# Patient Record
Sex: Male | Born: 1965 | Hispanic: Yes | Marital: Single | Smoking: Never smoker
Health system: Southern US, Community
[De-identification: ages and names within clinical notes are randomized; demographics above are authoritative.]

## PROBLEM LIST (undated history)

## (undated) DIAGNOSIS — E119 Type 2 diabetes mellitus without complications: Secondary | ICD-10-CM

## (undated) HISTORY — PX: HEMORRHOID SURGERY: SHX153

---

## 2008-04-04 ENCOUNTER — Emergency Department: Payer: Self-pay | Admitting: Internal Medicine

## 2011-06-06 ENCOUNTER — Emergency Department: Payer: Self-pay | Admitting: Emergency Medicine

## 2019-02-22 ENCOUNTER — Emergency Department: Payer: Self-pay

## 2019-02-22 ENCOUNTER — Emergency Department
Admission: EM | Admit: 2019-02-22 | Discharge: 2019-02-22 | Disposition: A | Discharge status: Home / Self Care | Payer: Self-pay | Attending: Emergency Medicine | Admitting: Emergency Medicine

## 2019-02-22 ENCOUNTER — Other Ambulatory Visit: Payer: Self-pay

## 2019-02-22 DIAGNOSIS — R42 Dizziness and giddiness: Secondary | ICD-10-CM | POA: Insufficient documentation

## 2019-02-22 DIAGNOSIS — R29818 Other symptoms and signs involving the nervous system: Secondary | ICD-10-CM | POA: Insufficient documentation

## 2019-02-22 DIAGNOSIS — R1110 Vomiting, unspecified: Secondary | ICD-10-CM | POA: Insufficient documentation

## 2019-02-22 DIAGNOSIS — E119 Type 2 diabetes mellitus without complications: Secondary | ICD-10-CM | POA: Insufficient documentation

## 2019-02-22 DIAGNOSIS — H9312 Tinnitus, left ear: Secondary | ICD-10-CM | POA: Insufficient documentation

## 2019-02-22 HISTORY — DX: Type 2 diabetes mellitus without complications: E11.9

## 2019-02-22 LAB — URINALYSIS, COMPLETE (UACMP) WITH MICROSCOPIC
Bacteria, UA: NONE SEEN
Bilirubin Urine: NEGATIVE
Glucose, UA: >=500 mg/dL — AB
Hgb urine dipstick: NEGATIVE
Ketones, ur: 20 mg/dL — AB
Leukocytes,Ua: NEGATIVE
Nitrite: NEGATIVE
Protein, ur: 30 mg/dL — AB
Specific Gravity, Urine: 1.03 (ref 1.005–1.030)
Squamous Epithelial / LPF: NONE SEEN (ref 0–5)
pH: 5 (ref 5.0–8.0)

## 2019-02-22 LAB — BASIC METABOLIC PANEL
Anion gap: 15 (ref 5–15)
BUN: 16 mg/dL (ref 6–20)
CO2: 20 mmol/L — ABNORMAL LOW (ref 22–32)
Calcium: 9.3 mg/dL (ref 8.9–10.3)
Chloride: 101 mmol/L (ref 98–111)
Creatinine, Ser: 0.91 mg/dL (ref 0.61–1.24)
GFR calc Af Amer: >60 mL/min (ref >60)
GFR calc non Af Amer: >60 mL/min (ref >60)
Glucose, Bld: 469 mg/dL — ABNORMAL HIGH (ref 70–99)
Potassium: 3.9 mmol/L (ref 3.5–5.1)
Sodium: 136 mmol/L (ref 135–145)

## 2019-02-22 LAB — GLUCOSE, CAPILLARY: Glucose-Capillary: 452 mg/dL — ABNORMAL HIGH (ref 70–99)

## 2019-02-22 LAB — CBC
HCT: 48 % (ref 39.0–52.0)
Hemoglobin: 16.6 g/dL (ref 13.0–17.0)
MCH: 30.7 pg (ref 26.0–34.0)
MCHC: 34.6 g/dL (ref 30.0–36.0)
MCV: 88.7 fL (ref 80.0–100.0)
Platelets: 247 10*3/uL (ref 150–400)
RBC: 5.41 MIL/uL (ref 4.22–5.81)
RDW: 12.8 % (ref 11.5–15.5)
WBC: 15.2 10*3/uL — ABNORMAL HIGH (ref 4.0–10.5)
nRBC: 0 % (ref 0.0–0.2)

## 2019-02-22 MED ORDER — MECLIZINE HCL 25 MG PO TABS
25.0000 mg | ORAL_TABLET | Freq: Once | ORAL | Status: AC
  Administered 2019-02-22: 25 mg via ORAL
  Filled 2019-02-22: qty 1

## 2019-02-22 MED ORDER — LORAZEPAM 2 MG/ML IJ SOLN
1.0000 mg | Freq: Once | INTRAMUSCULAR | Status: AC
  Administered 2019-02-22: 1 mg via INTRAVENOUS
  Filled 2019-02-22: qty 1

## 2019-02-22 MED ORDER — SODIUM CHLORIDE 0.9 % IV BOLUS
1000.0000 mL | Freq: Once | INTRAVENOUS | Status: AC
  Administered 2019-02-22: 13:00:00 1000 mL via INTRAVENOUS

## 2019-02-22 MED ORDER — PROMETHAZINE HCL 25 MG/ML IJ SOLN
25.0000 mg | Freq: Once | INTRAMUSCULAR | Status: AC
  Administered 2019-02-22: 16:00:00 25 mg via INTRAVENOUS
  Filled 2019-02-22: qty 1

## 2019-02-22 MED ORDER — MECLIZINE HCL 25 MG PO TABS: 25.0000 mg | ORAL_TABLET | Freq: Three times a day (TID) | ORAL | 0 refills | Status: AC | PRN

## 2019-02-22 MED ORDER — PROMETHAZINE HCL 25 MG/ML IJ SOLN
25.0000 mg | Freq: Once | INTRAMUSCULAR | Status: AC
  Administered 2019-02-22: 25 mg via INTRAVENOUS
  Filled 2019-02-22: qty 1

## 2019-02-22 MED ORDER — PROMETHAZINE HCL 25 MG PO TABS: 25.0000 mg | ORAL_TABLET | Freq: Four times a day (QID) | ORAL | 0 refills | Status: AC | PRN

## 2019-02-22 MED ORDER — LORAZEPAM 1 MG PO TABS: 1.0000 mg | ORAL_TABLET | Freq: Two times a day (BID) | ORAL | 0 refills | Status: AC

## 2019-02-22 MED ORDER — SODIUM CHLORIDE 0.9% FLUSH: 3.0000 mL | Freq: Once | INTRAVENOUS | Status: DC

## 2019-02-22 NOTE — Discharge Instructions (Addendum)
Take the Ativan 1 pill 3 times a day for the next few days, take the Antivert 1 pill 3 times a day.  These 2 will help with the spinning sensation.  Take the Phenergan 1 pill 4 times a day.  That will help with the nausea.  Please call Dr.Juengel, the ear nose and throat doctor.  Let his office know that you were seen in the emergency room for vertigo and you had ringing in your ear.  They should be able to see you and evaluate you further soon.  Please return here if you are worse.  Especially return here if you cannot keep down any fluids. [employer] se ha recomendado. Ativan 1 pildora 3 veses al dia por los sigiente 3 dias. Phenergan 4 veses al dia para la nauncia. Antivert 3 veses al dia para los mareos. Porfavor haga cita con el especialita de oido. Tomo mucho fluidos y continua sus medicamentos diario.

## 2019-02-22 NOTE — ED Provider Notes (Addendum)
[facility] Emergency Department Provider Note   ____________________________________________   First MD Initiated Contact with Patient 02/22/19 1110     (approximate)  I have reviewed the triage vital signs and the nursing notes.   HISTORY  Chief Complaint Dizziness and Emesis   HPI [name] is a 53 y.o. male who reports onset of vertigo with vomiting this morning.  Much worse with any head movement.  He has no other complaints of chest pain, belly pain, diarrhea etc.  Patient has not had this before.  He does complain of a ringing in the left ear.  He has not had his medicines this morning and has not had any medicine since yesterday morning.  Blood sugar is high.         Past Medical History:  Diagnosis Date  . Diabetes mellitus without complication (HCC)     There are no active problems to display for this patient.   Past Surgical History:  Procedure Laterality Date  . HEMORRHOID SURGERY      Prior to Admission medications   Not on File    Allergies Patient has no known allergies.  No family history on file.  Social History Social History   Tobacco Use  . Smoking status: Never Smoker  . Smokeless tobacco: Never Used  Substance Use Topics  . Alcohol use: Not Currently  . Drug use: Not Currently    Review of Systems  Constitutional: No fever/chills Eyes: No visual changes except vertigo/spinning. ENT: No sore throat. Cardiovascular: Denies chest pain. Respiratory: Denies shortness of breath. Gastrointestinal: No abdominal pain.  No nausea, no vomiting.  No diarrhea.  No constipation. Genitourinary: Negative for dysuria. Musculoskeletal: Negative for back pain. Skin: Negative for rash. Neurological: Negative for headaches, focal weakness   ____________________________________________   PHYSICAL EXAM:  VITAL SIGNS: ED Triage Vitals  Enc Vitals Group     BP 02/22/19 0947 (!) 167/88     Pulse Rate  02/22/19 0947 73     Resp 02/22/19 0947 20     Temp 02/22/19 0947 97.8 F (36.6 C)     Temp Source 02/22/19 0947 Oral     SpO2 02/22/19 0947 96 %     Weight 02/22/19 1004 190 lb (86.2 kg)     Height 02/22/19 1004 5\' 6"  (1.676 m)     Head Circumference --      Peak Flow --      Pain Score 02/22/19 1004 0     Pain Loc --      Pain Edu? --      Excl. in GC? --     Constitutional: Alert and oriented.  Looks ill and vomiting Eyes: Conjunctivae are normal. PER. EOMI. he is having nystagmus horizontal Head: Atraumatic. Nose: No congestion/rhinnorhea. Mouth/Throat: Mucous membranes are moist.  Oropharynx non-erythematous. Neck: No stridor.  { Cardiovascular: Normal rate, regular rhythm. Grossly normal heart sounds.  Good peripheral circulation. Respiratory: Normal respiratory effort.  No retractions. Lungs CTAB. Gastrointestinal: Soft and nontender. No distention. No abdominal bruits. No CVA tenderness. Musculoskeletal: No lower extremity tenderness nor edema.  Neurologic:  Normal speech and language. No gross focal neurologic deficits are appreciated.  Skin:  Skin is warm, dry and intact. No rash noted.   ____________________________________________   LABS (all labs ordered are listed, but only abnormal results are displayed)  Labs Reviewed  BASIC METABOLIC PANEL - Abnormal; Notable for the following components:      Result Value   CO2  20 (*)    Glucose, Bld 469 (*)    All other components within normal limits  CBC - Abnormal; Notable for the following components:   WBC 15.2 (*)    All other components within normal limits  URINALYSIS, COMPLETE (UACMP) WITH MICROSCOPIC - Abnormal; Notable for the following components:   Color, Urine STRAW (*)    APPearance CLEAR (*)    Glucose, UA >=500 (*)    Ketones, ur 20 (*)    Protein, ur 30 (*)    All other components within normal limits  GLUCOSE, CAPILLARY - Abnormal; Notable for the following components:   Glucose-Capillary 452  (*)    All other components within normal limits  CBG MONITORING, ED  CBG MONITORING, ED   ____________________________________________  EKG  EKG read interpreted by me shows normal sinus rhythm rate of 68 left axis no acute ST-T wave changes ____________________________________________  RADIOLOGY  ED MD interpretation: MRI read by radiology reviewed by me shows no acute disease  Official radiology report(s): Mr Brain Wo Contrast  Result Date: 02/22/2019 CLINICAL DATA:  Neuro deficits, subacute. Patient has had dizziness and emesis this morning, worsened by head movement, sensation at the room is "spinning" EXAM: MRI HEAD WITHOUT CONTRAST TECHNIQUE: Multiplanar, multiecho pulse sequences of the brain and surrounding structures were obtained without intravenous contrast. COMPARISON:  No pertinent prior studies available for comparison. FINDINGS: Brain: No restricted diffusion is demonstrated to suggest acute or recent subacute infarction. No evidence of intracranial mass. No intracranial hemorrhage, midline shift or extra-axial collection. There are a few punctate foci of T2/FLAIR hyperintensity scattered within the cerebral white matter, nonspecific. Cerebral volume is age appropriate. Vascular: Flow voids maintained within the proximal large vessels. Skull and upper cervical spine: Normal marrow signal. Sinuses/Orbits: The imaged globes and orbits are unremarkable. Trace ethmoid sinus mucosal thickening. No significant mastoid effusion. IMPRESSION: No evidence of acute intracranial abnormality on this non-contrast brain MRI. Specifically, no evidence of acute or recent subacute infarction. Several punctate signal changes scattered within the cerebral white matter are nonspecific, but may reflect minimal chronic small-vessel ischemic disease. Electronically Signed   By: Kellie Simmering   On: 02/22/2019 13:14    ____________________________________________   PROCEDURES  Procedure(s) performed  (including Critical Care):  Procedures   ____________________________________________   INITIAL IMPRESSION / ASSESSMENT AND PLAN / ED COURSE  Patient feeling better.  He is able to keep things down is not spinning or having vertigo.  He can keep his medicines down as well.  We will let him take his medicines and follow-up with ENT.  Return if worse.              ____________________________________________   FINAL CLINICAL IMPRESSION(S) / ED DIAGNOSES  Final diagnoses:  Vertigo     ED Discharge Orders    None       Note:  This document was prepared using Dragon voice recognition software and may include unintentional dictation errors.    Nena Polio, MD 02/22/19 1459    Nena Polio, MD 02/22/19 [phone]

## 2019-02-22 NOTE — ED Notes (Signed)
Patient off unit to MRI.

## 2019-02-22 NOTE — ED Triage Notes (Signed)
Pt states when he got up this morning was feeling very dizzy "like I was drunk".. with N/v.. denies any pain.

## 2019-02-22 NOTE — ED Notes (Signed)
Md at bedside to discuss MRI finding and follow plan with ENT.

## 2019-02-22 NOTE — ED Notes (Signed)
Via interpreter: pt has had dizziness and emesis this morning, worsening when he moves his head side to side. Denies abdominal pain, diarrhea, cp, sob, urinary symptoms. Pt has has numerous episodes of emesis; states room is "spinning."

## 2019-02-22 NOTE — ED Notes (Signed)
Returned from MRI 

## 2019-02-22 NOTE — ED Notes (Signed)
Assumed care of patient. As per patient feeling weak and fatigued yesterday, today woke up very dizzy and vomiting. Reports unable to stand without room spinning, any movement makes him dizzy and vomit. Reports has to stay still with eyes closed and no movement otherwise he feels dizzy and vomits". Denies hx of vertigo.  Awaiting md eval and plan of care.

## 2019-02-26 ENCOUNTER — Other Ambulatory Visit: Payer: Self-pay | Admitting: Physician Assistant

## 2019-02-26 DIAGNOSIS — H9120 Sudden idiopathic hearing loss, unspecified ear: Secondary | ICD-10-CM

## 2019-03-10 ENCOUNTER — Other Ambulatory Visit: Payer: Self-pay

## 2019-03-10 ENCOUNTER — Ambulatory Visit
Admission: RE | Admit: 2019-03-10 | Discharge: 2019-03-10 | Disposition: A | Discharge status: Home / Self Care | Payer: Self-pay | Source: Ambulatory Visit | Attending: Physician Assistant | Admitting: Physician Assistant

## 2019-03-10 DIAGNOSIS — H9120 Sudden idiopathic hearing loss, unspecified ear: Secondary | ICD-10-CM | POA: Insufficient documentation

## 2019-03-10 MED ORDER — GADOBUTROL 1 MMOL/ML IV SOLN
7.5000 mL | Freq: Once | INTRAVENOUS | Status: AC | PRN
  Administered 2019-03-10: 7.5 mL via INTRAVENOUS

## 2019-11-17 IMAGING — MR MR BRAIN/IAC WITHOUT AND WITH CONTRAST
10 of 13 series · 26 of 48 positions shown · IV contrast (7.5ml Gadavist)
Comparison: Brain MRI 02/22/2019

CLINICAL DATA: 52-year-old male with 3 weeks of acute left side
hearing loss. No known injury. Positive for [id number] in [REDACTED].

EXAM:
MRI HEAD WITHOUT AND WITH CONTRAST
TECHNIQUE: Multiplanar, multiecho pulse sequences of the brain and surrounding
structures were obtained without and with intravenous contrast.
CONTRAST:  7.5 milliliters Gadavist

[Series 5: T1 · sagittal · 5.0mm · 0.62mm/px · 2 of 23 slices shown (1 of 3)]
[im 1/23]
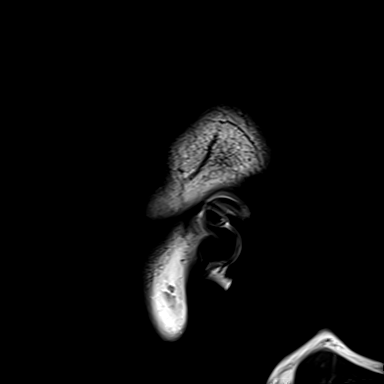
[im 23/23]
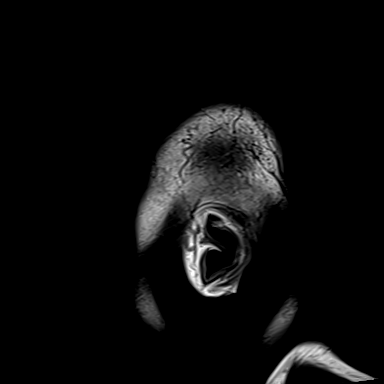

[Series 6: ax dwi_tracew · axial · 3.0mm · 0.60mm/px · z∈[-39,+107]mm · 4 of 48 slices shown]
[im 1/48]
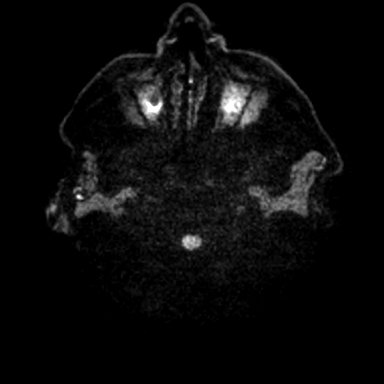
[im 16/48]
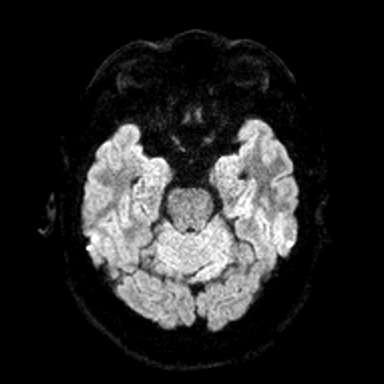
[im 32/48]
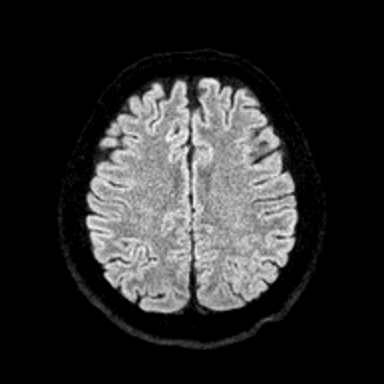
[im 48/48]
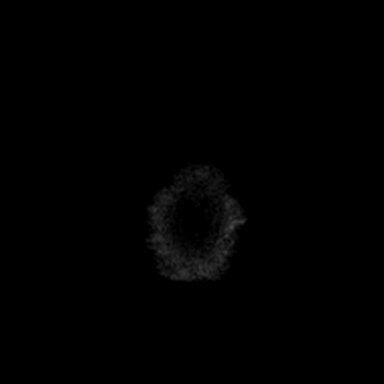

[Series 7: ax dwi_adc · axial · 3.0mm · 0.60mm/px · z∈[-39,+8]mm · 2 of 48 slices shown]
[im 1/48]
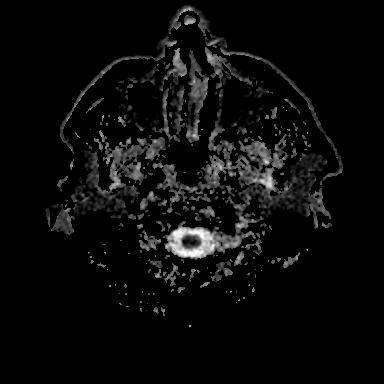
[im 16/48]
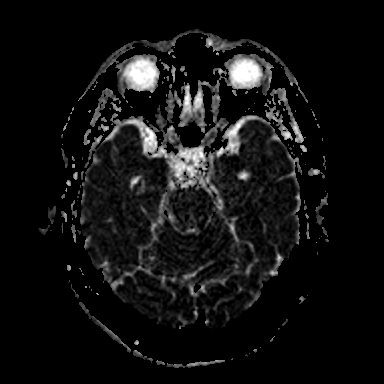

[Series 8: T2 · axial · 5.0mm · 0.53mm/px · z∈[-39,+107]mm · 2 of 27 slices shown]
[im 1/27]
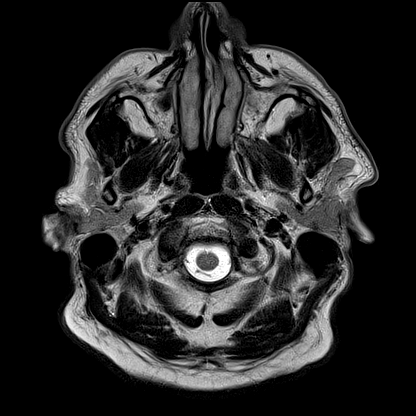
[im 27/27]
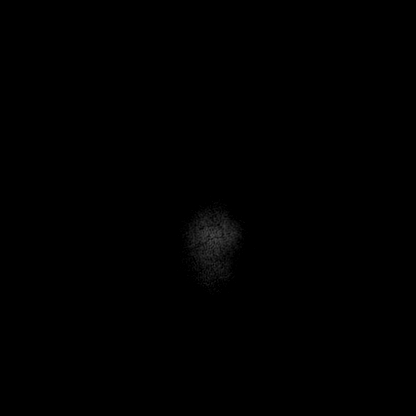

[Series 13: FLAIR · axial · 3.0mm · 0.53mm/px · z∈[-42,+110]mm · 4 of 55 slices shown]
[im 1/55]
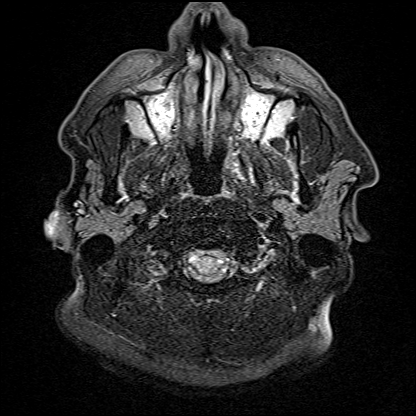
[im 19/55]
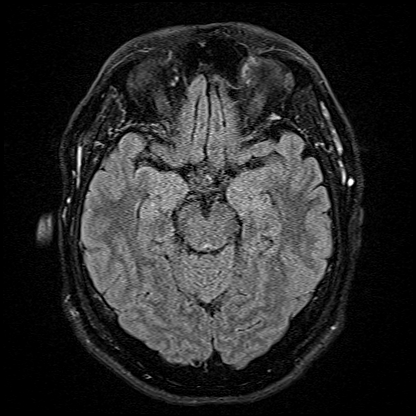
[im 37/55]
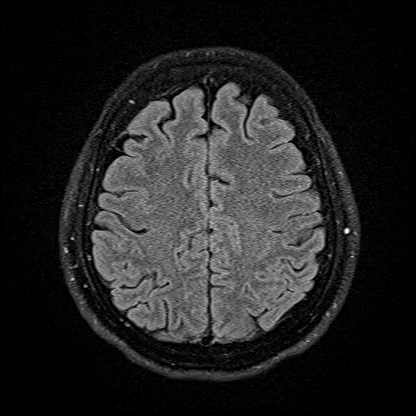
[im 55/55]
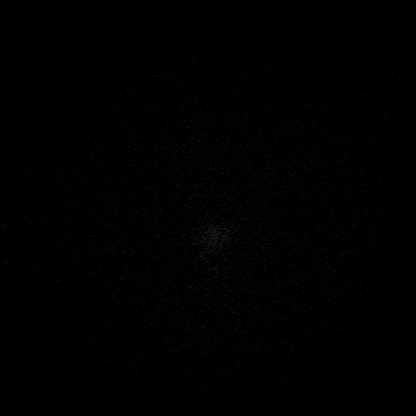

[Series 15: T1 · axial · non-contrast · 3.0mm · 0.21mm/px · 1 of 15 slices shown (2 of 3)]
[im 1/15]
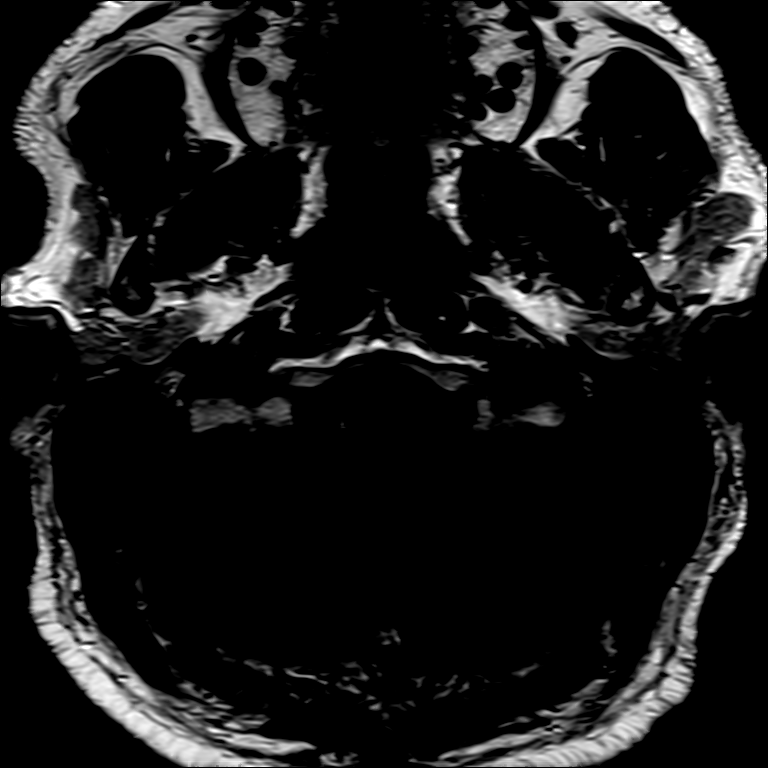

[Series 16: T1 · coronal · non-contrast · 3.0mm · 0.21mm/px · 1 of 13 slices shown (3 of 3)]
[im 1/13]
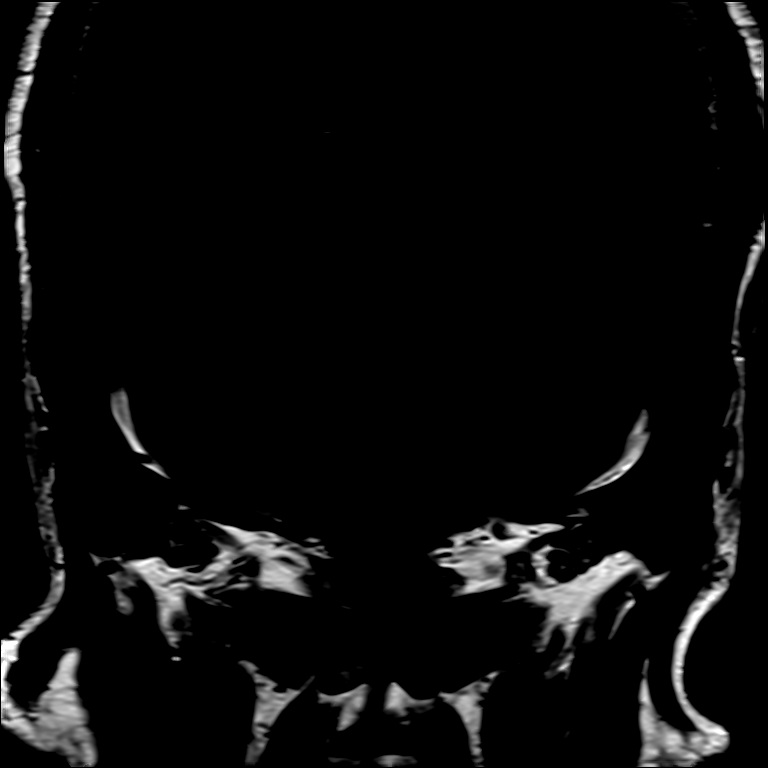

[Series 17: T1 post-contrast · axial · 3.0mm · 0.21mm/px · 1 of 15 slices shown (1 of 3)]
[im 1/15]
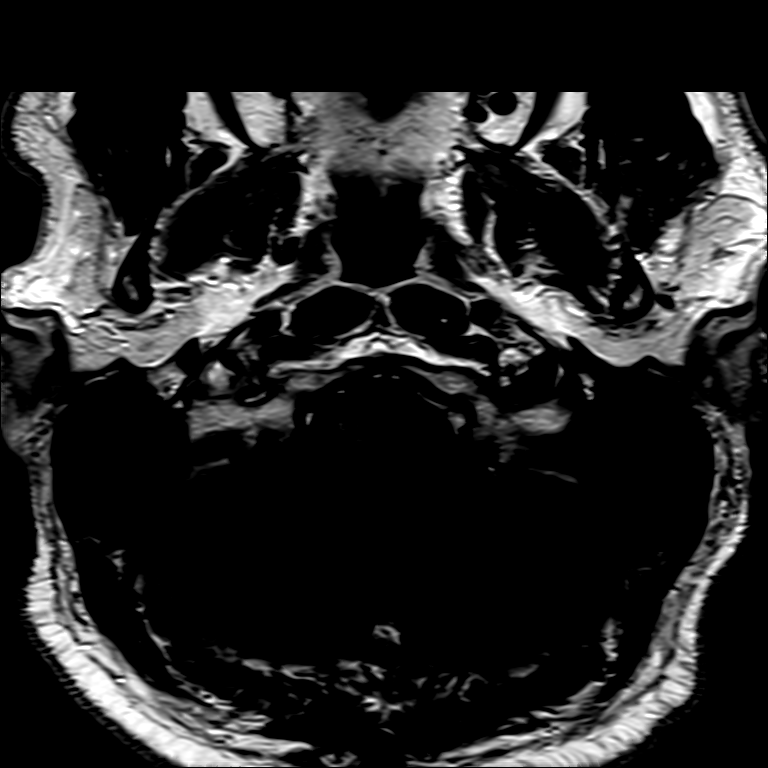

[Series 18: T1 post-contrast · coronal · 3.0mm · 0.21mm/px · 1 of 13 slices shown (2 of 3)]
[im 1/13]
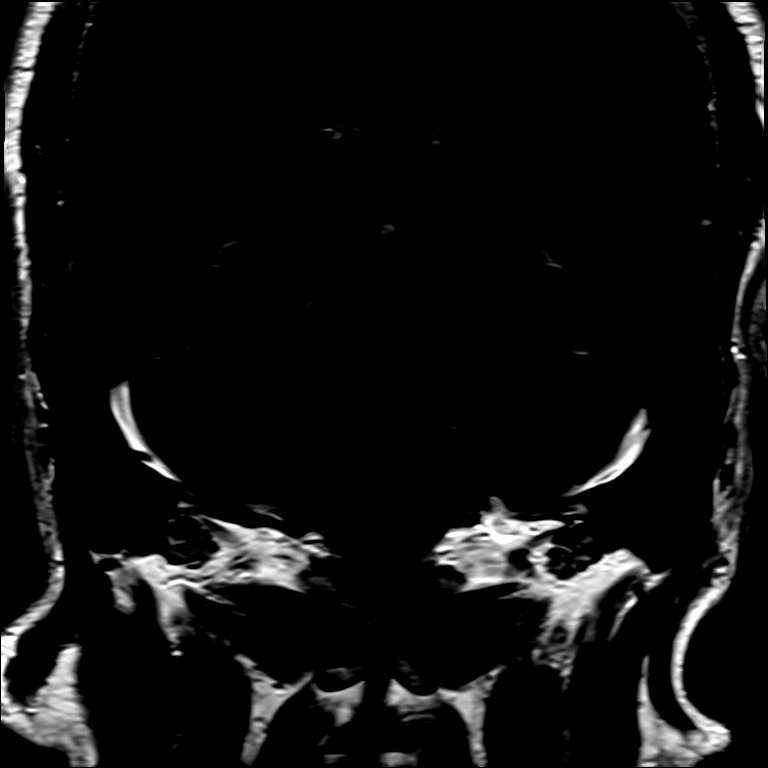

[Series 19: T1 post-contrast · axial · 1.0mm · 0.98mm/px · z∈[-42,+121]mm · 8 of 176 slices shown (3 of 3)]
[im 1/176]
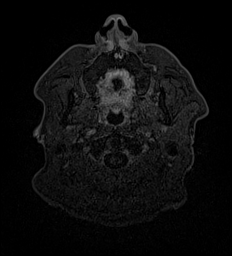
[im 27/176]
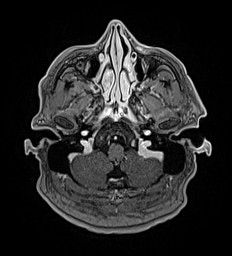
[im 54/176]
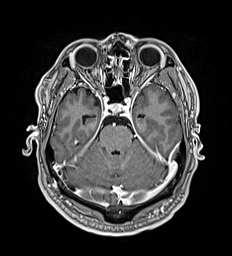
[im 81/176]
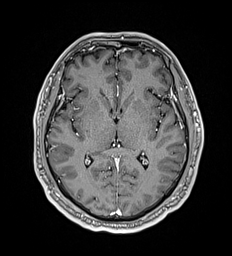
[im 95/176]
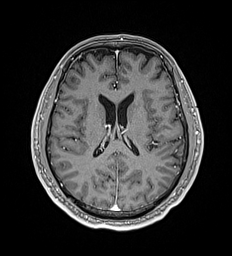
[im 122/176]
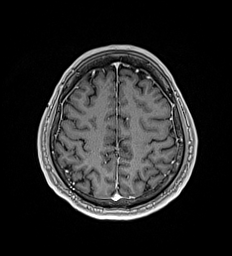
[im 149/176]
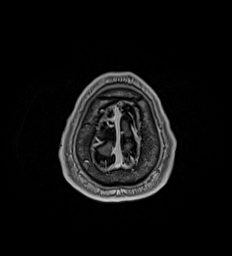
[im 176/176]
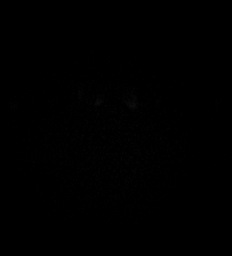

[26 of 48 positions shown; findings below may reference images not displayed]

FINDINGS: Brain: No restricted diffusion to suggest acute infarction. No
midline shift, mass effect, evidence of mass lesion,
ventriculomegaly, extra-axial collection or acute intracranial
hemorrhage. Cervicomedullary junction and pituitary are within
normal limits.

Gray and white matter signal is stable and within normal limits for
age. No cortical encephalomalacia. No chronic blood products. No
abnormal enhancement identified.

Vascular: Major intracranial vascular flow voids are stable.

Skull and upper cervical spine: Negative visible cervical spine.
Visualized bone marrow signal is within normal limits.

Sinuses/Orbits: Stable and negative orbits. Paranasal sinuses are
well pneumatized, there is some nonspecific maxillary sinus
periosteal thickening. Scalp and face soft tissues appear negative.

Other:

Dedicated internal auditory imaging. Normal cerebellopontine angles.
Normal bilateral cisternal and intracanalicular 7th and 8th cranial
nerve segments. Symmetric and normal T2 signal in the bilateral
cochlea and vestibular structures. Bilateral mastoids remain clear.
Normal stylomastoid foramina. No abnormal enhancement identified. No
skull base abnormality identified, the cavernous sinus appears
normal. Bilateral parotid glands appear normal.
IMPRESSION: 1. Negative internal auditory imaging.
2. Stable and normal for age MRI appearance of the brain.

## 2020-12-06 DIAGNOSIS — H8110 Benign paroxysmal vertigo, unspecified ear: Secondary | ICD-10-CM | POA: Insufficient documentation

## 2020-12-06 DIAGNOSIS — I160 Hypertensive urgency: Secondary | ICD-10-CM | POA: Insufficient documentation

## 2020-12-06 DIAGNOSIS — E119 Type 2 diabetes mellitus without complications: Secondary | ICD-10-CM | POA: Insufficient documentation

## 2020-12-06 DIAGNOSIS — I639 Cerebral infarction, unspecified: Secondary | ICD-10-CM | POA: Insufficient documentation

## 2021-02-28 ENCOUNTER — Ambulatory Visit: Payer: Medicaid Other

## 2021-03-02 ENCOUNTER — Ambulatory Visit: Payer: Medicaid Other

## 2021-03-02 ENCOUNTER — Ambulatory Visit: Payer: Medicaid Other | Attending: Physical Medicine and Rehabilitation

## 2021-03-02 ENCOUNTER — Other Ambulatory Visit: Payer: Self-pay

## 2021-03-02 DIAGNOSIS — R278 Other lack of coordination: Secondary | ICD-10-CM | POA: Insufficient documentation

## 2021-03-02 DIAGNOSIS — R269 Unspecified abnormalities of gait and mobility: Secondary | ICD-10-CM | POA: Insufficient documentation

## 2021-03-02 DIAGNOSIS — M6281 Muscle weakness (generalized): Secondary | ICD-10-CM | POA: Insufficient documentation

## 2021-03-02 DIAGNOSIS — R2689 Other abnormalities of gait and mobility: Secondary | ICD-10-CM | POA: Insufficient documentation

## 2021-03-02 DIAGNOSIS — R262 Difficulty in walking, not elsewhere classified: Secondary | ICD-10-CM

## 2021-03-02 DIAGNOSIS — R2681 Unsteadiness on feet: Secondary | ICD-10-CM

## 2021-03-02 NOTE — Therapy (Signed)
[place] MAIN [facility] SERVICES [address] [place], [place], [address] Phone: [phone]   Fax:  [phone]  Occupational Therapy Evaluation  Patient Details  Name: Dennis Mcintosh MRN: [id number] Date of Birth: [birth year] Referring Provider (OT): Dr. Morrie Sheldon   Encounter Date: 03/02/2021   OT End of Session - 03/02/21 0937     Visit Number 1    Number of Visits 1    Authorization Type Medicaid    Authorization Time Period 03/02/2021    OT Start Time 0825    OT Stop Time 0915    OT Time Calculation (min) 50 min    Equipment Utilized During Treatment wc    Activity Tolerance Patient tolerated treatment well    Behavior During Therapy [facility] for tasks assessed/performed             Past Medical History:  Diagnosis Date   Diabetes mellitus without complication ([facility])     Past Surgical History:  Procedure Laterality Date   HEMORRHOID SURGERY      There were no vitals filed for this visit.   Subjective Assessment - 03/02/21 0934     Subjective  "I used the walker at home with someone helpling me."    Patient is accompanied by: Family member;Interpreter    Pertinent History Recent CVA on 12/06/2020; ED visit d/t N/V related to vertigo    Limitations dizziness, balance, mobility    Patient Stated Goals "To get back to work and improve my walking."    Currently in Pain? No/denies    Multiple Pain Sites No               [facility] OT Assessment - 03/02/21 0001       Assessment   Medical Diagnosis CVA    Referring Provider (OT) Dr. Morrie Sheldon    Onset Date/Surgical Date 12/06/20    Hand Dominance Right    Next MD Visit Aug 15 PCP    Prior Therapy inpatient rehab      Precautions   Precautions Fall      Restrictions   Weight Bearing Restrictions No    Other Position/Activity Restrictions vertigo      Balance Screen   Has the patient fallen in the past 6 months No    Has the patient had a decrease in activity level  because of a fear of falling?  Yes    Is the patient reluctant to leave their home because of a fear of falling?  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  Performs  functional transfers with RW and caregiver supv/CGA      Mobility   Mobility Status Needs assist    Mobility Status Comments wc for community mobility, RW with 1 caregiver assist for house hold mobility      Written Expression   Dominant Hand Right      Vision - History   Baseline Vision No visual deficits    Patient Visual Report Nausea/blurring vision with head movement    Additional Comments occasional spinning/blurred vision related to vertigo      Vision Assessment   Tracking/Visual Pursuits Able to track stimulus in all quads without difficulty    Saccades Within functional limits    Visual Fields No apparent deficits      Cognition   Overall Cognitive Status Within Functional Limits for tasks assessed    Cognition Comments spouse reports that pt often asks if it's night or day and questions the date, though pt was oriented x4 this day.  OT recommended spouse place calendar and clock in bedroom or tx room to help pt with orientating self on a daily basis      Observation/Other  Assessments   Skin Integrity no denies any skin breakdown    Focus on Therapeutic Outcomes (FOTO)  88      Sensation   Light Touch Appears Intact      Coordination   Gross Motor Movements are Fluid and Coordinated Yes    Fine Motor Movements are Fluid and Coordinated Yes    Right 9 Hole Peg Test 36 sec    Left 9 Hole Peg Test 37 sec      AROM   Overall AROM Comments BUEs WNL      Strength   Overall Strength Comments BUEs grossly 5/5      Hand Function   Right Hand Grip (lbs) 59    Right Hand Lateral Pinch 20 lbs    Right Hand 3 Point Pinch 16 lbs    Left Hand Grip (lbs) 51    Left Hand Lateral Pinch 17 lbs    Left 3 point pinch 16 lbs            Occupational Therapy Evaluation: Pt is a 55 y/o male admitted to the hospital on 12/06/2020 with L hemisphere cerebellar CVA.  Pt was discharged home with family from inpatient rehab.  Returned to ED on 02/26/2021 d/t nausea/vomiting related to vertigo.  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  Pt currently using RW with caregiver assist for household mobility, wc for community mobility.  Pt's goal is to improve mobility and return to work.  Upon assessment, BUEs are WNL for strength, ROM, coordination, and sensation.  Pt reports occasional blurred vision or spinning related to his vertigo, but no other visual changes since CVA.  ADLs are performed with set up/supv d/t mobility deficits.  Pt has necessary AE/DME at home to perform ADLs with supv, which family is providing.  No additional skilled OT indicated at this time. Pt and spouse both agreeable to poc.  Pt will continue with PT to address mobility needs.    OT Education - 03/02/21 0936     Education Details OT role/goals/poc    Person(s) Educated Patient;Spouse;Child(ren)    Methods Explanation;Verbal cues    Comprehension Verbalized understanding              OT Short Term Goals - 03/02/21 3845  OT SHORT  TERM GOAL #1   Title AE needs met to safely perform ADLs.    Baseline Eval: goal met; pt has RW, wc, transfer tub bench, and 3in1 commode    Time 1    Period Days    Status Achieved    Target Date 03/02/21                      Plan - 03/02/21 1057     Clinical Impression Statement Pt is a 55 y/o male admitted to the hospital on 12/06/2020 with L hemisphere cerebellar CVA.  Pt was discharged home with family from inpatient rehab.  Returned to ED on 02/26/2021 d/t nausea/vomiting related to vertigo.  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  Pt currently using RW with caregiver assist for household mobility, wc for community mobility.  Pt's goal is to improve mobility and return to work.  Upon assessment, BUEs are WNL for strength, ROM, coordination, and sensation.  Pt reports occasional blurred vision or spinning related to his vertigo, but no other visual changes since CVA.  ?ADLs are performed with set up/supv d/t mobility deficits.  Pt has necessary AE/DME at home to perform ADLs with supv, which family is providing.  ?No additional skilled OT indicated at this time. Pt and spouse both agreeable to poc.  Pt will continue with PT to address mobility needs.    OT Occupational Profile and History Detailed Assessment- Review of Records and additional review of physical, cognitive, psychosocial history related to current functional performance    Occupational performance deficits (Please refer to evaluation for details): ADL's;IADL's;Work    Body Structure / Function / Physical Skills ADL;Endurance;Balance;Vestibular;Mobility;Gait    Rehab Potential Good    Clinical Decision Making Several treatment options, min-mod task modification necessary    Comorbidities Affecting Occupational Performance: May have comorbidities impacting occupational performance    Modification or Assistance to Complete Evaluation  Min-Moderate modification  of tasks or assist with assess necessary to complete eval    OT Frequency One time visit    Plan OT eval only.  Pt will see PT to address mobility needs.    Consulted and Agree with Plan of Care Patient;Family member/caregiver             Patient will benefit from skilled therapeutic intervention in order to improve the following deficits and impairments:   Body Structure / Function / Physical Skills: ADL, Endurance, Balance, Vestibular, Mobility, Gait       Visit Diagnosis: Muscle weakness (generalized)  Other lack of coordination    Problem List There are no problems to display for this patient.  Leta Speller, MS, OTR/L  Darleene Cleaver 03/02/2021, 10:59 AM  [place] MAIN [facility] SERVICES [address] [place], [place], [address] Phone: [phone]   Fax:  [phone]  Name: Dennis Mcintosh MRN: [id number] Date of Birth: 29-Jan-1966

## 2021-03-02 NOTE — Therapy (Signed)
[facility] MAIN [facility] SERVICES [address] [place], [place], [address] Phone: [phone]   Fax:  [phone]  Physical Therapy Evaluation  Patient Details  Name: Dennis Mcintosh MRN: [id number] Date of Birth: [birth year] Referring Provider (PT): Dr. Windle Guard   Encounter Date: 03/02/2021   PT End of Session - 03/02/21 0900     Visit Number 1    Number of Visits 25    Authorization Time Period Initial Certification 03/02/2021-05/25/2021    PT Start Time 0915    PT Stop Time 0958    PT Time Calculation (min) 43 min    Equipment Utilized During Treatment Gait belt    Activity Tolerance Patient tolerated treatment well    Behavior During Therapy [facility] for tasks assessed/performed             Past Medical History:  Diagnosis Date   Diabetes mellitus without complication (HCC)     Past Surgical History:  Procedure Laterality Date   HEMORRHOID SURGERY      There were no vitals filed for this visit.    Subjective Assessment - 03/02/21 0856     Subjective Patient reports having a stroke and complains of being weak with difficulty walking.    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 S/p removal of trach/G tube on 5/27. He most recently presented to ED on 02/26/2021 due to nausea and vomiting. He was at [facility] rehab from 01/31/2021- 02/23/2021.    Limitations Lifting;Standing;Walking;House hold activities    How long can you sit comfortably? no issues    How long can you stand comfortably? < 5 min    How long can you walk comfortably? < 5 min    Patient Stated Goals I want to go back to work    Currently in Pain? No/denies                [facility] PT Assessment - 03/02/21 0837       Assessment   Medical Diagnosis Left cerebellar  hemisphere CVA    Referring Provider (PT) Dr. Windle Guard    Onset Date/Surgical Date 12/06/20    Hand Dominance Right    Next MD Visit 03/12/2021    Prior Therapy inpatient rehab      Precautions   Precautions Fall      Restrictions   Weight Bearing Restrictions No      Balance Screen   Has the patient fallen in the past 6 months No    Has the patient had a decrease in activity level because of a fear of falling?  Yes    Is the patient reluctant to leave their home because of a fear of falling?  Yes      Home Environment   Living Environment Private residence    Living Arrangements Spouse/significant other;Children    Available Help at Discharge Family    Type of Home House    Home Access Stairs to enter    Entrance Stairs-Number of Steps 2    Entrance Stairs-Rails Can reach both    Home Layout One level    Home Equipment Walker - 2 wheels;Wheelchair - [employer];Tub bench      Prior Function   Level of Independence Independent    Vocation Full time employment    Development worker, community  Leisure enjoy spending time with family      Cognition   Overall Cognitive Status Within Functional Limits for tasks assessed    Attention Focused    Focused Attention Appears intact    Memory Appears intact    Awareness Appears intact    Problem Solving Appears intact    Executive Function Reasoning;Sequencing;Organizing;Decision Making;Initiating;Self Monitoring;Self Correcting    Reasoning Appears intact    Sequencing Appears intact    Organizing Appears intact    Decision Making Appears intact    Initiating Appears intact    Self Monitoring Appears intact    Self Correcting Appears intact      Observation/Other Assessments   Skin Integrity No observed or reported skin integrity issues    Focus on Therapeutic Outcomes (FOTO)  58      Sensation   Light Touch Appears Intact      Standardized Balance Assessment   Standardized Balance  Assessment Berg Balance Test;Timed Up and Go Test;Five Times Sit to Stand;10 meter walk test    Five times sit to stand comments  36.11 sec using BUE suport (unable to stand wihtout UEs)    10 Meter Walk 34.5 sec with RW=      Berg Balance Test   Sit to Stand Able to stand using hands after several tries    Standing Unsupported Unable to stand 30 seconds unassisted    Sitting with Back Unsupported but Feet Supported on Floor or Stool Able to sit safely and securely 2 minutes    Stand to Sit Sits independently, has uncontrolled descent    Transfers Needs one person to assist    Standing Unsupported with Eyes Closed Needs help to keep from falling    Standing Unsupported with Feet Together Needs help to attain position and unable to hold for 15 seconds    From Standing, Reach Forward with Outstretched Arm Loses balance while trying/requires external support    From Standing Position, Pick up Object from Floor Unable to try/needs assist to keep balance    From Standing Position, Turn to Look Behind Over each Shoulder Needs assist to keep from losing balance and falling    Turn 360 Degrees Needs assistance while turning    Standing Unsupported, Alternately Place Feet on Step/Stool Needs assistance to keep from falling or unable to try    Standing Unsupported, One Foot in [employer] balance while stepping or standing    Standing on One Leg Unable to try or needs assist to prevent fall    Total Score 8      Timed Up and Go Test   TUG Normal TUG    Normal TUG (seconds) 45.14             SUBJECTIVE Chief complaint: Onset: 12/06/2020- Left cerebellar CVA Recent changes in overall health/medication: yes- CVA Directional pattern for falls: None Prior history of physical therapy for balance: None  OBJECTIVE  MUSCULOSKELETAL: Tremor: Absent Bulk: Normal Tone: Normal, no clonus  Posture No gross abnormalities noted in standing or seated posture  Gait Patient ambulated approx 85  feet with use of RW, Catheter, and CGA using gait belt- short recipocal steps with loss of balance while turning. Patient reports fatigued with gait on level surfaces and exhibited gait speed of 0.29 m/s  Strength  *Please refer to OT eval (date 03/02/2021) for measurement of BUE Strength  R/L 4/4 Hip flexion 5/5 Hip external rotation 5/5 Hip internal rotation 5/5 Hip extension  5/5 Hip abduction 5/5  Hip adduction 4/4 Knee extension 4/4 Knee flexion 5/5 Ankle Plantarflexion 5/5 Ankle Dorsiflexion   NEUROLOGICAL:  Mental Status Patient is oriented to person, place and time.  Recent memory is intact.  Remote memory is intact.  Attention span and concentration are intact.  Expressive speech is intact. Patient is quiet and reserve per family report  Sensation Grossly intact to light touch bilateral UEs/LEs as determined by testing dermatomes C2-T2/L2-S2 respectively    FUNCTIONAL OUTCOME MEASURES   Results Comments  BERG 8/56 Fall risk, in need of intervention          TUG 45.14 seconds   5TSTS 36.11 seconds B UE support (unable to stand without BUE support)      10 Meter Gait Speed Self-selected: 34.5s = 0.29 m/s;  Below normative values for full community ambulation               ASSESSMENT Clinical Impression: Pt is a pleasant 55 year-old male referred with diagnosis of left cerebellar CVA. PT examination reveals deficits . Pt presents with deficits in LE functional strength as seen by difficulty standing, impaired gait and balance requiring RW for limited mobility and scoring 8/56 on BERG placing him at very high risk of falling. Pt will benefit from skilled PT services to address deficits in balance and decrease risk for future falls.                        Objective measurements completed on examination: See above findings.               PT Education - 03/02/21 0859     Education Details PT plan of care    Person(s) Educated  Patient    Methods Explanation;Verbal cues    Comprehension Verbalized understanding;Verbal cues required;Need further instruction              PT Short Term Goals - 03/02/21 1144       PT SHORT TERM GOAL #1   Title Pt will be independent with HEP in order to improve strength and balance in order to decrease fall risk and improve function at home and work.    Baseline 03/02/2021- Patient with no formal HEP in place    Time 6    Period Weeks    Status New    Target Date 04/13/21      PT SHORT TERM GOAL #2   Title Patient will demonstrate safe and modified independent sit to stand transfers for improved independence in the home and decreased caregiver assist.    Baseline 03/02/2021- Paitent requires min assist for safe transfers in clinic today.    Time 6    Period Weeks    Status New    Target Date 04/13/21               PT Long Term Goals - 03/02/21 1146       PT LONG TERM GOAL #1   Title Pt will improve FOTO to target score of 73 to display perceived improvements in ability to complete ADL's.    Baseline 03/02/2021= 58%    Time 12    Period Weeks    Status New    Target Date 05/25/21      PT LONG TERM GOAL #2   Title Pt will improve BERG by at least 13 points in order to demonstrate clinically significant improvement in balance.    Baseline 03/02/2021= 8/56    Time 12  Period Weeks    Status New    Target Date 05/25/21      PT LONG TERM GOAL #3   Title Pt will decrease 5TSTS by at least 10 seconds in order to demonstrate clinically significant improvement in LE strength.    Baseline 03/02/2021=36.11 sec with BUE Support    Time 12    Period Weeks    Status New    Target Date 05/25/21      PT LONG TERM GOAL #4   Title Pt will decrease TUG to below 18 seconds/decrease in order to demonstrate decreased fall risk.    Baseline 03/02/2021= 45.14 sec using RW    Time 12    Period Weeks    Status New    Target Date 05/25/21      PT LONG TERM GOAL #5   Title  Patient will be modified independent in walking on even/uneven surface with least restrictive assistive device, for 20+ minutes without rest break, reporting some difficulty or less to improve walking tolerance with community ambulation including grocery shopping, going to church,etc.    Baseline 03/02/2021= Patient limited to < 100 feet using RW    Time 12    Period Weeks    Status New    Target Date 05/25/21                    Plan - 03/02/21 1128     Clinical Impression Statement Pt is a pleasant 55 year-old male referred with diagnosis of left cerebellar CVA. PT examination reveals deficits . Pt presents with deficits in LE functional strength as seen by difficulty standing, impaired gait and balance requiring RW for limited mobility and scoring 8/56 on BERG placing him at very high risk of falling. Pt will benefit from skilled PT services to address deficits in balance and decrease risk for future falls.    Personal Factors and Comorbidities Comorbidity 1    Comorbidities diabetes    Examination-Activity Limitations Bathing;Bed Mobility;Bend;Caring for Others;Carry;Dressing;Lift;Locomotion Level;Squat;Stairs;Stand;Transfers    Examination-Participation Restrictions Cleaning;Community Activity;Driving;Occupation;Yard Work    Conservation officer, historic buildings Stable/Uncomplicated    Visual merchandiser    Rehab Potential Good    PT Frequency 2x / week    PT Duration 12 weeks    PT Treatment/Interventions ADLs/Self Care Home Management;Cryotherapy;Moist Heat;DME Instruction;Gait training;Stair training;Functional mobility training;Therapeutic activities;Therapeutic exercise;Balance training;Neuromuscular re-education;Patient/family education;Manual techniques;Passive range of motion;Dry needling    PT Next Visit Plan Initiate Balance and LE strengthening activities    PT Home Exercise Plan Will initiate HEP for balance and strength next visit.    Recommended Other  Services None- OT evaluated just prior to PT and was evalation only    Consulted and Agree with Plan of Care Patient;Family member/caregiver;Other (Comment)    Family Member Consulted Wife, Dtr, Interpreter- Annice Pih             Patient will benefit from skilled therapeutic intervention in order to improve the following deficits and impairments:  Abnormal gait, Decreased activity tolerance, Decreased balance, Decreased coordination, Decreased endurance, Decreased mobility, Difficulty walking, Decreased strength  Visit Diagnosis: Abnormality of gait and mobility  Difficulty in walking, not elsewhere classified  Other lack of coordination  Muscle weakness (generalized)  Unsteadiness on feet     Problem List There are no problems to display for this patient.   Lenda Kelp, PT 03/02/2021, 11:55 AM   [facility] MAIN [facility] SERVICES [address] [place], [place], [address] Phone:  [phone]   Fax:  [phone]  Name: Dennis Mcintosh MRN: [id number] Date of Birth: 1966-01-27

## 2021-03-08 ENCOUNTER — Ambulatory Visit: Payer: Medicaid Other

## 2021-03-12 ENCOUNTER — Ambulatory Visit: Payer: Medicaid Other

## 2021-03-12 ENCOUNTER — Other Ambulatory Visit: Payer: Self-pay

## 2021-03-12 DIAGNOSIS — M6281 Muscle weakness (generalized): Secondary | ICD-10-CM | POA: Diagnosis not present

## 2021-03-12 DIAGNOSIS — R262 Difficulty in walking, not elsewhere classified: Secondary | ICD-10-CM

## 2021-03-12 DIAGNOSIS — R278 Other lack of coordination: Secondary | ICD-10-CM

## 2021-03-12 DIAGNOSIS — R269 Unspecified abnormalities of gait and mobility: Secondary | ICD-10-CM

## 2021-03-12 DIAGNOSIS — R2689 Other abnormalities of gait and mobility: Secondary | ICD-10-CM

## 2021-03-12 NOTE — Therapy (Signed)
[place] [facility] MAIN [facility] SERVICES [address] [place], [place], [address] Phone: [phone]   Fax:  [phone]  Physical Therapy Treatment  Patient Details  Name: Dennis Mcintosh MRN: [id number] Date of Birth: 09-Jul-1966 Referring Provider (PT): Dr. Windle Guard   Encounter Date: 03/12/2021   PT End of Session - 03/12/21 0820     Visit Number 2    Number of Visits 25    Authorization Time Period Initial Certification 03/02/2021-05/25/2021    PT Start Time 0813    PT Stop Time 0845    PT Time Calculation (min) 32 min    Equipment Utilized During Treatment Gait belt    Activity Tolerance Patient tolerated treatment well    Behavior During Therapy [facility] for tasks assessed/performed             Past Medical History:  Diagnosis Date   Diabetes mellitus without complication (HCC)     Past Surgical History:  Procedure Laterality Date   HEMORRHOID SURGERY      There were no vitals filed for this visit.   Subjective Assessment - 03/12/21 0818     Subjective Patient reports doing okay today. No report of pain but does report some intermittent dizziness. .    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 S/p removal of trach/G tube on 5/27. He most recently presented to ED on 02/26/2021 due to nausea and vomiting. He was at [facility] rehab from 01/31/2021- 02/23/2021.    Limitations Lifting;Standing;Walking;House hold activities    How long can you sit comfortably? no issues    How long can you stand comfortably? < 5 min    How long can you walk comfortably? < 5 min    Patient Stated Goals I want to go back to work    Currently in Pain? No/denies            *Patient was 13 min late for appointment Interventions: Instructed patient via  interpreter in seated Lower extremity strengthening exercises.   Seated Hip march Seated Hip abd (using GTB)  Seated knee ext Seated calf raises Seated Toe raises Sit to stand (using UE support) Patient performed 10-12 reps with instruction to perform up to 3 sets of 10 reps daily for now. Patient and wife verbalized understanding and issued handout of the following:    Access Code: [email or web address] URL: [email or web address] Date: 03/08/2021 Prepared by: Maureen Ralphs  Exercises Seated March - 1 x daily - 5 x weekly - 3 sets - 10 reps - 2 hold Seated Hip Abduction - 1 x daily - 5 x weekly - 3 sets - 10 reps - 2 hold Seated Long Arc Quad - 1 x daily - 5 x weekly - 3 sets - 10 reps - 2 hold Seated Heel Raise - 1 x daily - 5 x weekly - 3 sets - 10 reps - 2 hold Seated Heel Toe Raises - 1 x daily - 5 x weekly - 3 sets - 10 reps - 2 hold Sit to Stand with Armchair - 1 x daily - 5 x weekly - 3 sets - 10 reps - 2 hold    Education provided throughout session via VC/TC and demonstration to facilitate movement at target joints and correct muscle activation for all testing and exercises performed.   Clinical Impression: Treatment was limited  secondary to patient being late for appointment. He was introduced to seated LE Strengthening exercises and able to perform with VC, TC, and visual demo today. He was mostly quiet through session but rated most activities as easy or medium. Patient will continue to benefit from skilled PT services for LE strengthening, dynamic balance and improved functional mobility to decrease fall risk and improve quality of life.                     PT Education - 03/12/21 0819     Education Details Exercise techniques for Home program    Person(s) Educated Patient    Methods Explanation;Demonstration;Tactile cues;Verbal cues;Handout    Comprehension Verbalized understanding;Returned demonstration;Verbal cues required;Tactile cues  required;Need further instruction              PT Short Term Goals - 03/02/21 1144       PT SHORT TERM GOAL #1   Title Pt will be independent with HEP in order to improve strength and balance in order to decrease fall risk and improve function at home and work.    Baseline 03/02/2021- Patient with no formal HEP in place    Time 6    Period Weeks    Status New    Target Date 04/13/21      PT SHORT TERM GOAL #2   Title Patient will demonstrate safe and modified independent sit to stand transfers for improved independence in the home and decreased caregiver assist.    Baseline 03/02/2021- Paitent requires min assist for safe transfers in clinic today.    Time 6    Period Weeks    Status New    Target Date 04/13/21               PT Long Term Goals - 03/02/21 1146       PT LONG TERM GOAL #1   Title Pt will improve FOTO to target score of 73 to display perceived improvements in ability to complete ADL's.    Baseline 03/02/2021= 58%    Time 12    Period Weeks    Status New    Target Date 05/25/21      PT LONG TERM GOAL #2   Title Pt will improve BERG by at least 13 points in order to demonstrate clinically significant improvement in balance.    Baseline 03/02/2021= 8/56    Time 12    Period Weeks    Status New    Target Date 05/25/21      PT LONG TERM GOAL #3   Title Pt will decrease 5TSTS by at least 10 seconds in order to demonstrate clinically significant improvement in LE strength.    Baseline 03/02/2021=36.11 sec with BUE Support    Time 12    Period Weeks    Status New    Target Date 05/25/21      PT LONG TERM GOAL #4   Title Pt will decrease TUG to below 18 seconds/decrease in order to demonstrate decreased fall risk.    Baseline 03/02/2021= 45.14 sec using RW    Time 12    Period Weeks    Status New    Target Date 05/25/21      PT LONG TERM GOAL #5   Title Patient will be modified independent in walking on even/uneven surface with least restrictive assistive  device, for 20+ minutes without rest break, reporting some difficulty or less to improve walking tolerance with community ambulation including grocery  shopping, going to church,etc.    Baseline 03/02/2021= Patient limited to < 100 feet using RW    Time 12    Period Weeks    Status New    Target Date 05/25/21                   Plan - 03/12/21 0857     Clinical Impression Statement Treatment was limited secondary to patient being late for appointment. He was introduced to seated LE Strengthening exercises and able to perform with VC, TC, and visual demo today. He was mostly quiet through session but rated most activities as easy or medium. Patient will continue to benefit from skilled PT services for LE strengthening, dynamic balance and improved functional mobility to decrease fall risk and improve quality of life.    Personal Factors and Comorbidities Comorbidity 1    Comorbidities diabetes    Examination-Activity Limitations Bathing;Bed Mobility;Bend;Caring for Others;Carry;Dressing;Lift;Locomotion Level;Squat;Stairs;Stand;Transfers    Examination-Participation Restrictions Cleaning;Community Activity;Driving;Occupation;Yard Work    Stability/Clinical Decision Making Stable/Uncomplicated    Rehab Potential Good    PT Frequency 2x / week    PT Duration 12 weeks    PT Treatment/Interventions ADLs/Self Care Home Management;Cryotherapy;Moist Heat;DME Instruction;Gait training;Stair training;Functional mobility training;Therapeutic activities;Therapeutic exercise;Balance training;Neuromuscular re-education;Patient/family education;Manual techniques;Passive range of motion;Dry needling    PT Next Visit Plan Initiate Balance and progress LE strengthening activities as appropriate.    PT Home Exercise Plan Initiated Seated LE strengthening for HEP.Access Code: [email or web address]  URL: [email or web address]    Consulted and Agree with Plan of Care Patient;Family member/caregiver;Other  (Comment)    Family Member Consulted Wife, Dtr, Interpreter- Annice Pih             Patient will benefit from skilled therapeutic intervention in order to improve the following deficits and impairments:  Abnormal gait, Decreased activity tolerance, Decreased balance, Decreased coordination, Decreased endurance, Decreased mobility, Difficulty walking, Decreased strength  Visit Diagnosis: Abnormality of gait and mobility  Difficulty in walking, not elsewhere classified  Muscle weakness (generalized)  Other abnormalities of gait and mobility  Other lack of coordination     Problem List There are no problems to display for this patient.   Lenda Kelp, PT 03/12/2021, 9:16 AM  [facility] MAIN [facility] SERVICES [address] [place], [place], [address] Phone: [phone]   Fax:  [phone]  Name: Dennis Mcintosh MRN: [id number] Date of Birth: [birth year]

## 2021-03-19 ENCOUNTER — Ambulatory Visit: Payer: Medicaid Other

## 2021-03-19 ENCOUNTER — Other Ambulatory Visit: Payer: Self-pay

## 2021-03-19 DIAGNOSIS — R278 Other lack of coordination: Secondary | ICD-10-CM

## 2021-03-19 DIAGNOSIS — R269 Unspecified abnormalities of gait and mobility: Secondary | ICD-10-CM

## 2021-03-19 DIAGNOSIS — M6281 Muscle weakness (generalized): Secondary | ICD-10-CM

## 2021-03-19 DIAGNOSIS — R2689 Other abnormalities of gait and mobility: Secondary | ICD-10-CM

## 2021-03-19 DIAGNOSIS — R262 Difficulty in walking, not elsewhere classified: Secondary | ICD-10-CM

## 2021-03-19 NOTE — Therapy (Signed)
[place] [facility] MAIN [facility] SERVICES [address] [place], [place], [address] Phone: [phone]   Fax:  [phone]  Physical Therapy Treatment  Patient Details  Name: Dennis Mcintosh MRN: [id number] Date of Birth: August 14, 1965 Referring Provider (PT): Dr. Windle Guard   Encounter Date: 03/19/2021   PT End of Session - 03/19/21 0808     Visit Number 3    Number of Visits 25    Authorization Time Period Initial Certification 03/02/2021-05/25/2021    PT Start Time 0802    PT Stop Time 0845    PT Time Calculation (min) 43 min    Equipment Utilized During Treatment Gait belt    Activity Tolerance Patient tolerated treatment well    Behavior During Therapy [facility] for tasks assessed/performed             Past Medical History:  Diagnosis Date   Diabetes mellitus without complication (HCC)     Past Surgical History:  Procedure Laterality Date   HEMORRHOID SURGERY      There were no vitals filed for this visit.   Subjective Assessment - 03/19/21 0806     Subjective Patient reports feeling okay today without complaints. 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 S/p removal of trach/G tube on 5/27. He most recently presented to ED on 02/26/2021 due to nausea and vomiting. He was at [facility] rehab from 01/31/2021- 02/23/2021.    Limitations Lifting;Standing;Walking;House hold activities    How long can you sit comfortably? no issues    How long can you stand comfortably? < 5 min    How long can you walk comfortably? < 5 min    Patient Stated Goals I want to go back to work    Currently in Pain? No/denies             Interventions:  Interpreter Tonna Corner) present as well as his wife and  dtr for instruction.   Static standing balance exercises:   Standing in corner area next to steps and support bar with walker positioned in front of patient- Instruction in static standing with varying foot position, eyes either open/closed as instructed and head turning as instructed.   1) feet wide 2) feet narrowed  3) feet staggered 4) feet tandem Start with eyes open- count to 20 sec then if stable progress to eyes closed- count to 20 then to EO/head turns and finally 5 reps of EC with head turns. Patient with mild increase sway A/P and Lateral with loss of balance with EC and head turns- did improve with practice.    He was able to progress to only eyes open with tandem stance as this was difficult and he was unable to hold position >4 sec.  Added Standing SLS - Patient attempted yet unable to hold > 2 sec either leg with multiple     Access Code: [email or web address] URL: [email or web address] Date: 03/19/2021 Prepared by: Maureen Ralphs, PT  Exercises Standing with Head Rotation - 1 x daily - 3 x weekly - 3 sets - 3 reps Standing Balance with Eyes Closed - 1 x daily - 3 x weekly - 3 sets - 4 reps Standing Balance in Corner - 1 x daily - 3 x weekly Standing Romberg to 1/4 Tandem Stance -  1 x daily - 3 x weekly - 3 sets - 4 reps Tandem Stance - 1 x daily - 3 x weekly - 3 sets - 4 reps Single Leg Stance - 1 x daily - 3 x weekly - 3 sets - 3 reps  Gait: 150 feet with RW, CGA exhibiting decreased step length and increased UE support. Limited by fatigue.    Clinical Impression: Patient was introduced to beginner static balance exercises today with assistance of interpreter. He was challenged with more narrowed feet position and any activity with eyes closed. He verbalized understanding of handout and wife verbalized that she would be able to provide supervision for safety. He responded well to all activities and did improve with practice. Patient will continue to benefit  from skilled PT services for LE strengthening, dynamic balance and improved functional mobility to decrease fall risk and improve quality of life.                   PT Education - 03/19/21 0807     Education Details exercise technique    Person(s) Educated Patient    Methods Explanation;Demonstration;Tactile cues;Verbal cues    Comprehension Verbalized understanding;Returned demonstration;Verbal cues required;Tactile cues required;Need further instruction              PT Short Term Goals - 03/02/21 1144       PT SHORT TERM GOAL #1   Title Pt will be independent with HEP in order to improve strength and balance in order to decrease fall risk and improve function at home and work.    Baseline 03/02/2021- Patient with no formal HEP in place    Time 6    Period Weeks    Status New    Target Date 04/13/21      PT SHORT TERM GOAL #2   Title Patient will demonstrate safe and modified independent sit to stand transfers for improved independence in the home and decreased caregiver assist.    Baseline 03/02/2021- Paitent requires min assist for safe transfers in clinic today.    Time 6    Period Weeks    Status New    Target Date 04/13/21               PT Long Term Goals - 03/02/21 1146       PT LONG TERM GOAL #1   Title Pt will improve FOTO to target score of 73 to display perceived improvements in ability to complete ADL's.    Baseline 03/02/2021= 58%    Time 12    Period Weeks    Status New    Target Date 05/25/21      PT LONG TERM GOAL #2   Title Pt will improve BERG by at least 13 points in order to demonstrate clinically significant improvement in balance.    Baseline 03/02/2021= 8/56    Time 12    Period Weeks    Status New    Target Date 05/25/21      PT LONG TERM GOAL #3   Title Pt will decrease 5TSTS by at least 10 seconds in order to demonstrate clinically significant improvement in LE strength.    Baseline 03/02/2021=36.11 sec with BUE Support     Time 12    Period Weeks    Status New    Target Date 05/25/21      PT LONG TERM GOAL #4   Title Pt will decrease TUG to below 18 seconds/decrease in order to demonstrate decreased  fall risk.    Baseline 03/02/2021= 45.14 sec using RW    Time 12    Period Weeks    Status New    Target Date 05/25/21      PT LONG TERM GOAL #5   Title Patient will be modified independent in walking on even/uneven surface with least restrictive assistive device, for 20+ minutes without rest break, reporting some difficulty or less to improve walking tolerance with community ambulation including grocery shopping, going to church,etc.    Baseline 03/02/2021= Patient limited to < 100 feet using RW    Time 12    Period Weeks    Status New    Target Date 05/25/21                   Plan - 03/19/21 3532     Clinical Impression Statement Patient was introduced to beginner static balance exercises today with assistance of interpreter. He was challenged with more narrowed feet position and any activity with eyes closed. He verbalized understanding of handout and wife verbalized that she would be able to provide supervision for safety. He responded well to all activities and did improve with practice. Patient will continue to benefit from skilled PT services for LE strengthening, dynamic balance and improved functional mobility to decrease fall risk and improve quality of life.    Personal Factors and Comorbidities Comorbidity 1    Comorbidities diabetes    Examination-Activity Limitations Bathing;Bed Mobility;Bend;Caring for Others;Carry;Dressing;Lift;Locomotion Level;Squat;Stairs;Stand;Transfers    Examination-Participation Restrictions Cleaning;Community Activity;Driving;Occupation;Yard Work    Stability/Clinical Decision Making Stable/Uncomplicated    Rehab Potential Good    PT Frequency 2x / week    PT Duration 12 weeks    PT Treatment/Interventions ADLs/Self Care Home Management;Cryotherapy;Moist  Heat;DME Instruction;Gait training;Stair training;Functional mobility training;Therapeutic activities;Therapeutic exercise;Balance training;Neuromuscular re-education;Patient/family education;Manual techniques;Passive range of motion;Dry needling    PT Next Visit Plan Initiate Balance and progress LE strengthening activities as appropriate.    PT Home Exercise Plan Access Code: [email or web address]  URL: [email or web address]    Consulted and Agree with Plan of Care Patient;Family member/caregiver;Other (Comment)    Family Member Consulted Wife, Dtr, Interpreter- Annice Pih             Patient will benefit from skilled therapeutic intervention in order to improve the following deficits and impairments:  Abnormal gait, Decreased activity tolerance, Decreased balance, Decreased coordination, Decreased endurance, Decreased mobility, Difficulty walking, Decreased strength  Visit Diagnosis: Abnormality of gait and mobility  Difficulty in walking, not elsewhere classified  Muscle weakness (generalized)  Other abnormalities of gait and mobility  Other lack of coordination     Problem List There are no problems to display for this patient.   Lenda Kelp, PT 03/19/2021, 9:56 AM  [place] [facility] MAIN [facility] SERVICES [address] [place], [place], [address] Phone: [phone]   Fax:  [phone]  Name: Dennis Mcintosh MRN: [id number] Date of Birth: [birth year]

## 2021-03-21 ENCOUNTER — Ambulatory Visit: Payer: Medicaid Other

## 2021-03-30 ENCOUNTER — Other Ambulatory Visit: Payer: Self-pay

## 2021-03-30 ENCOUNTER — Ambulatory Visit: Payer: Medicaid Other | Attending: Physical Medicine and Rehabilitation

## 2021-03-30 DIAGNOSIS — R278 Other lack of coordination: Secondary | ICD-10-CM | POA: Insufficient documentation

## 2021-03-30 DIAGNOSIS — R2681 Unsteadiness on feet: Secondary | ICD-10-CM | POA: Diagnosis present

## 2021-03-30 DIAGNOSIS — R269 Unspecified abnormalities of gait and mobility: Secondary | ICD-10-CM | POA: Diagnosis not present

## 2021-03-30 DIAGNOSIS — R490 Dysphonia: Secondary | ICD-10-CM | POA: Diagnosis present

## 2021-03-30 DIAGNOSIS — R262 Difficulty in walking, not elsewhere classified: Secondary | ICD-10-CM | POA: Insufficient documentation

## 2021-03-30 DIAGNOSIS — R2689 Other abnormalities of gait and mobility: Secondary | ICD-10-CM | POA: Insufficient documentation

## 2021-03-30 DIAGNOSIS — M6281 Muscle weakness (generalized): Secondary | ICD-10-CM | POA: Insufficient documentation

## 2021-03-30 NOTE — Therapy (Signed)
[place] [facility] MAIN [facility] SERVICES [address] [place], [place], [address] Phone: [phone]   Fax:  [phone]  Physical Therapy Treatment  Patient Details  Name: Dennis Mcintosh MRN: [id number] Date of Birth: 1965/12/15 Referring Provider (PT): Dr. Windle Guard   Encounter Date: 03/30/2021   PT End of Session - 03/30/21 1120     Visit Number 4    Number of Visits 25    Authorization Time Period Initial Certification 03/02/2021-05/25/2021    PT Start Time 0801    PT Stop Time 0844    PT Time Calculation (min) 43 min    Equipment Utilized During Treatment Gait belt    Activity Tolerance Patient tolerated treatment well    Behavior During Therapy [facility] for tasks assessed/performed             Past Medical History:  Diagnosis Date   Diabetes mellitus without complication (HCC)     Past Surgical History:  Procedure Laterality Date   HEMORRHOID SURGERY      There were no vitals filed for this visit.   Subjective Assessment - 03/30/21 1113     Subjective Patient reports doing well today and denies any falls or pain.    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 S/p removal of trach/G tube on 5/27. He most recently presented to ED on 02/26/2021 due to nausea and vomiting. He was at [facility] rehab from 01/31/2021- 02/23/2021.    Limitations Lifting;Standing;Walking;House hold activities    How long can you sit comfortably? no issues    How long can you stand comfortably? < 5 min    How long can you walk comfortably? < 5 min    Patient Stated Goals I want to go back to work    Currently in Pain? No/denies              INTERVENTIONS:   Gait training  In // bars: Patient performed forward walking with 1 UE support (right UE) down and back x 5  trials with VC to try to increase step length- and presents with narrowed BOS. He was able to respond to cues and improve with widen BOS/steps.  Instruction in use of quad cane today- Patient able to verbalize understanding with assist of interpreter and visual demo and perform well- no loss of balance for 75 feet x 2 trials.   Neuromuscular re-education:   In // bars- Patient performed static standing on blue airex pad without UE support x 20 sec x 3 bouts.  Progressed to A/P weight shifting on pad- x 20 reps - Patient with difficulty exhibiting loss of balance posteriorly mult times- CGA for all balance activities.  Static stand on blue airex pad with eyes closed x 20 sec - mild unsteadiness yet no loss of balance.  Static stand on blue airex pad with eyes open and head turning- left to right x 10 followed by eyes closed (2 LOB with EC requring min assist for balance)  Standing on compliant surface and step up onto 6" block x 10 (first 3 with BUE support and 7 with 1 UE support)   Clinical Impression: Patient able to progress to gait with quad cane versus walker today. He was responsive to all VC and demonstration and able to walk with short reciprocal gait with improving confidence with practice. He  was challenged with all standing static/dynamic balance activities today and required CGA throughout to maintain his balance.                         PT Education - 03/30/21 1119     Education Details instruction on how to use a cane    Person(s) Educated Patient;Spouse    Methods Explanation;Demonstration;Tactile cues;Verbal cues    Comprehension Verbalized understanding;Returned demonstration;Verbal cues required;Tactile cues required;Need further instruction              PT Short Term Goals - 03/02/21 1144       PT SHORT TERM GOAL #1   Title Pt will be independent with HEP in order to improve strength and balance in order to decrease fall risk and improve  function at home and work.    Baseline 03/02/2021- Patient with no formal HEP in place    Time 6    Period Weeks    Status New    Target Date 04/13/21      PT SHORT TERM GOAL #2   Title Patient will demonstrate safe and modified independent sit to stand transfers for improved independence in the home and decreased caregiver assist.    Baseline 03/02/2021- Paitent requires min assist for safe transfers in clinic today.    Time 6    Period Weeks    Status New    Target Date 04/13/21               PT Long Term Goals - 03/02/21 1146       PT LONG TERM GOAL #1   Title Pt will improve FOTO to target score of 73 to display perceived improvements in ability to complete ADL's.    Baseline 03/02/2021= 58%    Time 12    Period Weeks    Status New    Target Date 05/25/21      PT LONG TERM GOAL #2   Title Pt will improve BERG by at least 13 points in order to demonstrate clinically significant improvement in balance.    Baseline 03/02/2021= 8/56    Time 12    Period Weeks    Status New    Target Date 05/25/21      PT LONG TERM GOAL #3   Title Pt will decrease 5TSTS by at least 10 seconds in order to demonstrate clinically significant improvement in LE strength.    Baseline 03/02/2021=36.11 sec with BUE Support    Time 12    Period Weeks    Status New    Target Date 05/25/21      PT LONG TERM GOAL #4   Title Pt will decrease TUG to below 18 seconds/decrease in order to demonstrate decreased fall risk.    Baseline 03/02/2021= 45.14 sec using RW    Time 12    Period Weeks    Status New    Target Date 05/25/21      PT LONG TERM GOAL #5   Title Patient will be modified independent in walking on even/uneven surface with least restrictive assistive device, for 20+ minutes without rest break, reporting some difficulty or less to improve walking tolerance with community ambulation including grocery shopping, going to church,etc.    Baseline 03/02/2021= Patient limited to < 100 feet using RW     Time 12    Period Weeks    Status New    Target Date 05/25/21  Plan - 03/30/21 1120     Clinical Impression Statement Patient able to progress to gait with quad cane versus walker today. He was responsive to all VC and demonstration and able to walk with short reciprocal gait with improving confidence with practice. He was challenged with all standing static/dynamic balance activities today and required CGA throughout to maintain his balance. Patient will continue to benefit from skilled PT services for LE strengthening, dynamic balance and improved functional mobility to decrease fall risk and improve quality of life    Personal Factors and Comorbidities Comorbidity 1    Comorbidities diabetes    Examination-Activity Limitations Bathing;Bed Mobility;Bend;Caring for Others;Carry;Dressing;Lift;Locomotion Level;Squat;Stairs;Stand;Transfers    Examination-Participation Restrictions Cleaning;Community Activity;Driving;Occupation;Yard Work    Stability/Clinical Decision Making Stable/Uncomplicated    Rehab Potential Good    PT Frequency 2x / week    PT Duration 12 weeks    PT Treatment/Interventions ADLs/Self Care Home Management;Cryotherapy;Moist Heat;DME Instruction;Gait training;Stair training;Functional mobility training;Therapeutic activities;Therapeutic exercise;Balance training;Neuromuscular re-education;Patient/family education;Manual techniques;Passive range of motion;Dry needling    PT Next Visit Plan Continue with Balance and progress LE strengthening activities as appropriate.    PT Home Exercise Plan no changes    Consulted and Agree with Plan of Care Patient;Family member/caregiver;Other (Comment)    Family Member Consulted Wife, Dtr, Interpreter             Patient will benefit from skilled therapeutic intervention in order to improve the following deficits and impairments:  Abnormal gait, Decreased activity tolerance, Decreased balance, Decreased  coordination, Decreased endurance, Decreased mobility, Difficulty walking, Decreased strength  Visit Diagnosis: Abnormality of gait and mobility  Difficulty in walking, not elsewhere classified  Muscle weakness (generalized)  Other lack of coordination     Problem List There are no problems to display for this patient.   Lenda Kelp, PT 03/30/2021, 11:39 AM  [facility] MAIN [facility] SERVICES [address] [place], [place], [address] Phone: [phone]   Fax:  [phone]  Name: Dennis Mcintosh MRN: [id number] Date of Birth: [birth year]

## 2021-04-03 ENCOUNTER — Other Ambulatory Visit: Payer: Self-pay

## 2021-04-03 ENCOUNTER — Ambulatory Visit: Payer: Medicaid Other | Admitting: Physical Therapy

## 2021-04-03 DIAGNOSIS — R269 Unspecified abnormalities of gait and mobility: Secondary | ICD-10-CM | POA: Diagnosis not present

## 2021-04-03 DIAGNOSIS — M6281 Muscle weakness (generalized): Secondary | ICD-10-CM

## 2021-04-03 DIAGNOSIS — R2681 Unsteadiness on feet: Secondary | ICD-10-CM

## 2021-04-03 DIAGNOSIS — R2689 Other abnormalities of gait and mobility: Secondary | ICD-10-CM

## 2021-04-03 DIAGNOSIS — R278 Other lack of coordination: Secondary | ICD-10-CM

## 2021-04-03 DIAGNOSIS — R262 Difficulty in walking, not elsewhere classified: Secondary | ICD-10-CM

## 2021-04-03 NOTE — Therapy (Signed)
[facility] MAIN [facility] SERVICES [address] [place], [place], [address] Phone: [phone]   Fax:  [phone]  Physical Therapy Treatment  Patient Details  Name: Dennis Mcintosh MRN: [id number] Date of Birth: 15-Nov-1965 Referring Provider (PT): Dr. Windle Guard   Encounter Date: 04/03/2021   PT End of Session - 04/03/21 0940     Visit Number 5    Number of Visits 25    Authorization Time Period Initial Certification 03/02/2021-05/25/2021    PT Start Time 0855    PT Stop Time 0933    PT Time Calculation (min) 38 min    Equipment Utilized During Treatment Gait belt    Activity Tolerance Patient tolerated treatment well    Behavior During Therapy [facility] for tasks assessed/performed             Past Medical History:  Diagnosis Date   Diabetes mellitus without complication (HCC)     Past Surgical History:  Procedure Laterality Date   HEMORRHOID SURGERY      There were no vitals filed for this visit.   Subjective Assessment - 04/03/21 0857     Subjective Patient reports doing well today and denies any falls or pain. Has been performing HEP; no questions or concerns.    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 S/p removal of trach/G tube on 5/27. He most recently presented to ED on 02/26/2021 due to nausea and vomiting. He was at [facility] rehab from 01/31/2021- 02/23/2021.    Limitations Lifting;Standing;Walking;House hold activities    How long can you sit comfortably? no issues    How long can you stand comfortably? < 5 min    How long can you walk comfortably? < 5 min    Patient Stated Goals I want to go back to work    Currently in Pain? No/denies             INTERVENTIONS  Gait training   In // bars: Patient performed forward  walking with 1 UE support (right UE) down and back x 5 trials with VC to improve posture and increase stepping cadence - step length increased with cadence. He presents with narrowed BOS. He  responded well to VC. Over-ground ambulation using quad cane in RUE - 1 instance of scissoring, rightward trunk lean with heavy weight-bearing through RUE. Pt responded well to VC to keep AD closer to body resulting in improved posture and decreased trunk lean. 110 feet x 2 trials, sitting rest break between. CGA required for occasional lateral stability.   Neuromuscular re-education:    In // bars- Patient performed static standing on blue airex pad without UE support x 20 sec x 3 bouts.  Progressed to A/P weight shifting on pad- x 20 reps - Patient with difficulty exhibiting loss of balance posteriorly mult times- CGA for all balance activities.  Static standing on non-compliant surface with A-P and lateral perturbations, occasional lean on // bar due to LOB especially to the left. Static standing on blue airex pad with A-P and lateral  perturbations, occasional lean on // bar due to LOB in both directions.     Clinical Impression: Session limited due to pt arriving late and dizziness reported at end of session. Patient able to progress ambulation distance using quad cane. He continues to respond well to all VC and  demonstrations. Gait pattern includes short reciprocal steps, decreased cadence and narrow BOS; scissoring on one occasion with quad cane. He was challenged with all standing static balance activities today requiring CGA-MIN A throughout to maintain his balance, primarily posterior LOB.  Pt reported dizziness after perturbations activity. PT assisted pt to chair; symptoms improved prior to leaving. Pt will benefit from skilled PT services to address deficits in gait and balance for improved quality of life, to return to work and decrease fall risk.         PT Short Term Goals - 03/02/21 1144        PT SHORT TERM GOAL #1   Title Pt will be independent with HEP in order to improve strength and balance in order to decrease fall risk and improve function at home and work.    Baseline 03/02/2021- Patient with no formal HEP in place    Time 6    Period Weeks    Status New    Target Date 04/13/21      PT SHORT TERM GOAL #2   Title Patient will demonstrate safe and modified independent sit to stand transfers for improved independence in the home and decreased caregiver assist.    Baseline 03/02/2021- Paitent requires min assist for safe transfers in clinic today.    Time 6    Period Weeks    Status New    Target Date 04/13/21               PT Long Term Goals - 03/02/21 1146       PT LONG TERM GOAL #1   Title Pt will improve FOTO to target score of 73 to display perceived improvements in ability to complete ADL's.    Baseline 03/02/2021= 58%    Time 12    Period Weeks    Status New    Target Date 05/25/21      PT LONG TERM GOAL #2   Title Pt will improve BERG by at least 13 points in order to demonstrate clinically significant improvement in balance.    Baseline 03/02/2021= 8/56    Time 12    Period Weeks    Status New    Target Date 05/25/21      PT LONG TERM GOAL #3   Title Pt will decrease 5TSTS by at least 10 seconds in order to demonstrate clinically significant improvement in LE strength.    Baseline 03/02/2021=36.11 sec with BUE Support    Time 12    Period Weeks    Status New    Target Date 05/25/21      PT LONG TERM GOAL #4   Title Pt will decrease TUG to below 18 seconds/decrease in order to demonstrate decreased fall risk.    Baseline 03/02/2021= 45.14 sec using RW    Time 12    Period Weeks    Status New    Target Date 05/25/21      PT LONG TERM GOAL #5   Title Patient will be modified independent in walking on even/uneven surface with least restrictive assistive device, for 20+ minutes without rest break, reporting some difficulty or less to improve walking  tolerance with community ambulation including grocery shopping, going to church,etc.    Baseline 03/02/2021= Patient limited to < 100 feet using RW    Time 12    Period Weeks    Status New    Target Date 05/25/21  Plan - 04/03/21 0940     Clinical Impression Statement Session limited due to pt arriving late and dizziness reported at end of session. Patient able to progress ambulation distance using quad cane. He continues to respond well to all VC and demonstrations. Gait pattern includes short reciprocal steps, decreased cadence and narrow BOS; scissoring on one occasion with quad cane. He was challenged with all standing static balance activities today requiring CGA-MIN A throughout to maintain his balance, primarily posterior LOB.  Pt reported dizziness after perturbations activity. PT assisted pt to chair; symptoms improved prior to leaving. Pt will benefit from skilled PT services to address deficits in gait and balance for improved quality of life, to return to work and decrease fall risk.    Personal Factors and Comorbidities Comorbidity 1    Comorbidities diabetes    Examination-Activity Limitations Bathing;Bed Mobility;Bend;Caring for Others;Carry;Dressing;Lift;Locomotion Level;Squat;Stairs;Stand;Transfers    Examination-Participation Restrictions Cleaning;Community Activity;Driving;Occupation;Yard Work    Stability/Clinical Decision Making Stable/Uncomplicated    Rehab Potential Good    PT Frequency 2x / week    PT Duration 12 weeks    PT Treatment/Interventions ADLs/Self Care Home Management;Cryotherapy;Moist Heat;DME Instruction;Gait training;Stair training;Functional mobility training;Therapeutic activities;Therapeutic exercise;Balance training;Neuromuscular re-education;Patient/family education;Manual techniques;Passive range of motion;Dry needling    PT Next Visit Plan Continue with Balance and progress LE strengthening activities as appropriate.    PT  Home Exercise Plan no changes    Consulted and Agree with Plan of Care Patient;Family member/caregiver;Other (Comment)    Family Member Consulted Wife, Dtr, Interpreter             Patient will benefit from skilled therapeutic intervention in order to improve the following deficits and impairments:  Abnormal gait, Decreased activity tolerance, Decreased balance, Decreased coordination, Decreased endurance, Decreased mobility, Difficulty walking, Decreased strength  Visit Diagnosis: Abnormality of gait and mobility  Difficulty in walking, not elsewhere classified  Muscle weakness (generalized)  Other lack of coordination  Other abnormalities of gait and mobility  Unsteadiness on feet     Problem List There are no problems to display for this patient.   Basilia Jumbo PT, DPT  Lavenia Atlas 04/03/2021, 9:59 AM  [facility] MAIN [facility] SERVICES [address] [place], [place], [address] Phone: [phone]   Fax:  [phone]  Name: Dennis Mcintosh MRN: [id number] Date of Birth: [birth year]

## 2021-04-05 ENCOUNTER — Ambulatory Visit: Payer: Medicaid Other

## 2021-04-10 ENCOUNTER — Ambulatory Visit: Payer: Medicaid Other

## 2021-04-10 ENCOUNTER — Other Ambulatory Visit: Payer: Self-pay

## 2021-04-10 DIAGNOSIS — M6281 Muscle weakness (generalized): Secondary | ICD-10-CM

## 2021-04-10 DIAGNOSIS — R269 Unspecified abnormalities of gait and mobility: Secondary | ICD-10-CM | POA: Diagnosis not present

## 2021-04-10 DIAGNOSIS — R278 Other lack of coordination: Secondary | ICD-10-CM

## 2021-04-10 DIAGNOSIS — R262 Difficulty in walking, not elsewhere classified: Secondary | ICD-10-CM

## 2021-04-10 NOTE — Therapy (Signed)
[facility] MAIN [facility] SERVICES [address] [place], [place], [address] Phone: [phone]   Fax:  [phone]  Physical Therapy Treatment  Patient Details  Name: Dennis Mcintosh MRN: [id number] Date of Birth: 1965/08/20 Referring Provider (PT): Dr. Windle Guard   Encounter Date: 04/10/2021   PT End of Session - 04/10/21 1140     Visit Number 6    Number of Visits 25    Authorization Time Period Initial Certification 03/02/2021-05/25/2021    PT Start Time 1122    PT Stop Time 1150    PT Time Calculation (min) 28 min    Equipment Utilized During Treatment Gait belt    Activity Tolerance Patient tolerated treatment well    Behavior During Therapy [facility] for tasks assessed/performed             Past Medical History:  Diagnosis Date   Diabetes mellitus without complication (HCC)     Past Surgical History:  Procedure Laterality Date   HEMORRHOID SURGERY      There were no vitals filed for this visit.   Subjective Assessment - 04/11/21 1138     Subjective Patient reports that he has now obtained a quad cane but hasn't had much time to practice yet.    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 S/p removal of trach/G tube on 5/27. He most recently presented to ED on 02/26/2021 due to nausea and vomiting. He was at [facility] rehab from 01/31/2021- 02/23/2021.    Limitations Lifting;Standing;Walking;House hold activities    How long can you sit comfortably? no issues    How long can you stand comfortably? < 5 min    How long can you walk comfortably? < 5 min    Patient Stated Goals I want to go back to work    Currently in Pain? No/denies               Interventions:   Gait Training:  Instruction in use of quad cane: Patient ambulated around  75 feet x 2 trials today.   Over-ground ambulation using quad cane in RUE - 1 instance of scissoring,  trunk lean to right  with increased weight-bearing through RUE. Pt responded well to VC to keep AD closer to body resulting in improved posture and decreased trunk lean. 110 feet x 2 trials, sitting rest break between. CGA required for occasional lateral stability and multiple VC to widen steps   Neuromuscular re-education:    In // bars- Patient performed static standing on blue airex pad without UE support x 20 sec x 3 bouts.  Progressed to A/P weight shifting on pad- x 20 reps - Patient with CGA for all balance activities.    Clinical Impression: Session again very limited due to pt arriving 22 min late due to transportation issues. Patient responded well to all VC and demonstrations for improved gait performance with use of quad cane. Gait pattern includes short reciprocal steps, decreased cadence and narrow BOS; scissoring on multiple  occasions requiring min Assist to keep from falling.  He denied any dizziness throughout session and presents with good motivation and receptive to all cues today.  Pt will benefit from skilled PT services to address deficits in gait and balance for improved quality of life, to return to work and decrease fall risk.  PT Education - 04/11/21 1139     Education Details Balance training and ed in use of quad cane    Person(s) Educated Patient    Methods Explanation;Demonstration;Tactile cues;Verbal cues    Comprehension Verbal cues required;Returned demonstration;Verbalized understanding;Tactile cues required;Need further instruction              PT Short Term Goals - 03/02/21 1144       PT SHORT TERM GOAL #1   Title Pt will be independent with HEP in order to improve strength and balance in order to decrease fall risk and improve function at home and work.    Baseline 03/02/2021- Patient with no formal HEP in place     Time 6    Period Weeks    Status New    Target Date 04/13/21      PT SHORT TERM GOAL #2   Title Patient will demonstrate safe and modified independent sit to stand transfers for improved independence in the home and decreased caregiver assist.    Baseline 03/02/2021- Paitent requires min assist for safe transfers in clinic today.    Time 6    Period Weeks    Status New    Target Date 04/13/21               PT Long Term Goals - 03/02/21 1146       PT LONG TERM GOAL #1   Title Pt will improve FOTO to target score of 73 to display perceived improvements in ability to complete ADL's.    Baseline 03/02/2021= 58%    Time 12    Period Weeks    Status New    Target Date 05/25/21      PT LONG TERM GOAL #2   Title Pt will improve BERG by at least 13 points in order to demonstrate clinically significant improvement in balance.    Baseline 03/02/2021= 8/56    Time 12    Period Weeks    Status New    Target Date 05/25/21      PT LONG TERM GOAL #3   Title Pt will decrease 5TSTS by at least 10 seconds in order to demonstrate clinically significant improvement in LE strength.    Baseline 03/02/2021=36.11 sec with BUE Support    Time 12    Period Weeks    Status New    Target Date 05/25/21      PT LONG TERM GOAL #4   Title Pt will decrease TUG to below 18 seconds/decrease in order to demonstrate decreased fall risk.    Baseline 03/02/2021= 45.14 sec using RW    Time 12    Period Weeks    Status New    Target Date 05/25/21      PT LONG TERM GOAL #5   Title Patient will be modified independent in walking on even/uneven surface with least restrictive assistive device, for 20+ minutes without rest break, reporting some difficulty or less to improve walking tolerance with community ambulation including grocery shopping, going to church,etc.    Baseline 03/02/2021= Patient limited to < 100 feet using RW    Time 12    Period Weeks    Status New    Target Date 05/25/21                    Plan - 04/11/21 1140     Clinical Impression Statement Session again very limited due to pt arriving 22 min late due to transportation issues. Patient  responded well to all VC and demonstrations for improved gait performance with use of quad cane. Gait pattern includes short reciprocal steps, decreased cadence and narrow BOS; scissoring on multiple  occasions requiring min Assist to keep from falling.  He denied any dizziness throughout session and presents with good motivation and receptive to all cues today.  Pt will benefit from skilled PT services to address deficits in gait and balance for improved quality of life, to return to work and decrease fall risk.    Personal Factors and Comorbidities Comorbidity 1    Comorbidities diabetes    Examination-Activity Limitations Bathing;Bed Mobility;Bend;Caring for Others;Carry;Dressing;Lift;Locomotion Level;Squat;Stairs;Stand;Transfers    Examination-Participation Restrictions Cleaning;Community Activity;Driving;Occupation;Yard Work    Stability/Clinical Decision Making Stable/Uncomplicated    Rehab Potential Good    PT Frequency 2x / week    PT Duration 12 weeks    PT Treatment/Interventions ADLs/Self Care Home Management;Cryotherapy;Moist Heat;DME Instruction;Gait training;Stair training;Functional mobility training;Therapeutic activities;Therapeutic exercise;Balance training;Neuromuscular re-education;Patient/family education;Manual techniques;Passive range of motion;Dry needling    PT Next Visit Plan Continue with Balance and progress LE strengthening activities as appropriate.    PT Home Exercise Plan no changes    Consulted and Agree with Plan of Care Patient;Family member/caregiver;Other (Comment)    Family Member Consulted Wife, Dtr, Interpreter             Patient will benefit from skilled therapeutic intervention in order to improve the following deficits and impairments:  Abnormal gait, Decreased activity tolerance,  Decreased balance, Decreased coordination, Decreased endurance, Decreased mobility, Difficulty walking, Decreased strength  Visit Diagnosis: Abnormality of gait and mobility  Difficulty in walking, not elsewhere classified  Muscle weakness (generalized)  Other lack of coordination     Problem List There are no problems to display for this patient.   Lenda Kelp, PT 04/11/2021, 2:43 PM  [facility] MAIN [facility] SERVICES [address] [place], [place], [address] Phone: [phone]   Fax:  [phone]  Name: Marwan Lipe MRN: [id number] Date of Birth: Aug 19, 1965

## 2021-04-12 ENCOUNTER — Ambulatory Visit: Payer: Medicaid Other

## 2021-04-17 ENCOUNTER — Ambulatory Visit: Payer: Medicaid Other

## 2021-04-17 ENCOUNTER — Other Ambulatory Visit: Payer: Self-pay

## 2021-04-17 DIAGNOSIS — R269 Unspecified abnormalities of gait and mobility: Secondary | ICD-10-CM

## 2021-04-17 DIAGNOSIS — M6281 Muscle weakness (generalized): Secondary | ICD-10-CM

## 2021-04-17 DIAGNOSIS — R278 Other lack of coordination: Secondary | ICD-10-CM

## 2021-04-17 DIAGNOSIS — R262 Difficulty in walking, not elsewhere classified: Secondary | ICD-10-CM

## 2021-04-17 NOTE — Therapy (Signed)
[facility] MAIN [facility] SERVICES [address] [place], [place], [address] Phone: [phone]   Fax:  [phone]  Physical Therapy Treatment  Patient Details  Name: Dennis Mcintosh MRN: [id number] Date of Birth: 1966/03/30 Referring Provider (PT): Dr. Windle Guard   Encounter Date: 04/17/2021   PT End of Session - 04/17/21 0906     Visit Number 7    Number of Visits 25    Authorization Time Period Initial Certification 03/02/2021-05/25/2021    PT Start Time 0811    PT Stop Time 0845    PT Time Calculation (min) 34 min    Equipment Utilized During Treatment Gait belt    Activity Tolerance Patient tolerated treatment well    Behavior During Therapy [facility] for tasks assessed/performed             Past Medical History:  Diagnosis Date   Diabetes mellitus without complication (HCC)     Past Surgical History:  Procedure Laterality Date   HEMORRHOID SURGERY      There were no vitals filed for this visit.   Subjective Assessment - 04/17/21 0853     Subjective Patient reports doing okay today- Denies any falls and states he has exercising and walking some with cane. States still mostly using his walker as he feels more comfortable.    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 S/p removal of trach/G tube on 5/27. He most recently presented to ED on 02/26/2021 due to nausea and vomiting. He was at [facility] rehab from 01/31/2021- 02/23/2021.    Limitations Lifting;Standing;Walking;House hold activities    How long can you sit comfortably? no issues    How long can you stand comfortably? < 5 min    How long can you walk comfortably? < 5 min    Patient Stated Goals I want to go back to work    Currently in Pain? No/denies              INTERVENTIONS: *Interpreter Milly present for today's session.  Gait training: Patient performed ambulation around 120 feet x 2 trials with small based quad cane. Reviewed gait sequencing with patient  and initially he was demo more step to gait and unsteadiness yet improved overall with VC and practice- Able to progress to more reciprocal steps and clearing both feet well with only 2-3 episodes of scissoring. He was noticeably fatigued and reported "A little tired."    Neuromuscular re-ed:   Static stand with wide stance on balance airex pad- 30 sec with eyes open x 2 trials with minimal A/P/lateral sway.   Static stand on balance airex pad- attempted 10-20 sec x multiple attempts with eyes closed- Patient with increased unsteadiness immediately- with several LOB requiring min assist to recover.   Static stand with feet widened and eyes open- 30 sec with head turns- left to right- Less unsteady than eyes closed.   Attempted static stand with feet wide, EC, and head turns- patient unable to maintain balance without assist.   Static stand on pad- then dynamic marching in place x 15 reps each leg- Mild unsteadiness with posterior loss of balance.    Clinical Impression: Patient was 10 min late for appointment so visit was abbreviated to comply with schedule. Treatment focused on gait training with quad cane and static/dynamic balance exercises. He was  receptive to all gait cues and instruction with balance and able to demo improved gait quality on 2nd trial and improved balance with practice of technique. He was fatigued at end of session. Pt will benefit from skilled PT services to address deficits in gait and balance for improved quality of life, to return to work and decrease fall risk.                  PT Education - 04/17/21 0905     Education Details Balance ed and gait ed with use of quad cane    Person(s) Educated Patient    Methods  Demonstration;Explanation;Tactile cues;Verbal cues    Comprehension Verbalized understanding;Returned demonstration;Verbal cues required;Tactile cues required;Need further instruction              PT Short Term Goals - 03/02/21 1144       PT SHORT TERM GOAL #1   Title Pt will be independent with HEP in order to improve strength and balance in order to decrease fall risk and improve function at home and work.    Baseline 03/02/2021- Patient with no formal HEP in place    Time 6    Period Weeks    Status New    Target Date 04/13/21      PT SHORT TERM GOAL #2   Title Patient will demonstrate safe and modified independent sit to stand transfers for improved independence in the home and decreased caregiver assist.    Baseline 03/02/2021- Paitent requires min assist for safe transfers in clinic today.    Time 6    Period Weeks    Status New    Target Date 04/13/21               PT Long Term Goals - 03/02/21 1146       PT LONG TERM GOAL #1   Title Pt will improve FOTO to target score of 73 to display perceived improvements in ability to complete ADL's.    Baseline 03/02/2021= 58%    Time 12    Period Weeks    Status New    Target Date 05/25/21      PT LONG TERM GOAL #2   Title Pt will improve BERG by at least 13 points in order to demonstrate clinically significant improvement in balance.    Baseline 03/02/2021= 8/56    Time 12    Period Weeks    Status New    Target Date 05/25/21      PT LONG TERM GOAL #3   Title Pt will decrease 5TSTS by at least 10 seconds in order to demonstrate clinically significant improvement in LE strength.    Baseline 03/02/2021=36.11 sec with BUE Support    Time 12    Period Weeks    Status New    Target Date 05/25/21      PT LONG TERM GOAL #4   Title Pt will decrease TUG to below 18 seconds/decrease in order to demonstrate decreased fall risk.    Baseline 03/02/2021= 45.14 sec using RW    Time 12    Period Weeks    Status New    Target  Date 05/25/21      PT LONG TERM GOAL #5   Title Patient will be modified independent in walking on even/uneven surface with least restrictive assistive device, for 20+ minutes without rest break, reporting some difficulty or less to improve walking tolerance with community ambulation including grocery shopping, going to church,etc.  Baseline 03/02/2021= Patient limited to < 100 feet using RW    Time 12    Period Weeks    Status New    Target Date 05/25/21                   Plan - 04/17/21 0907     Clinical Impression Statement Patient was 10 min late for appointment so visit was abbreviated to comply with schedule. Treatment focused on gait training with quad cane and static/dynamic balance exercises. He was receptive to all gait cues and instruction with balance and able to demo improved gait quality on 2nd trial and improved balance with practice of technique. He was fatigued at end of session. Pt will benefit from skilled PT services to address deficits in gait and balance for improved quality of life, to return to work and decrease fall risk.    Personal Factors and Comorbidities Comorbidity 1    Comorbidities diabetes    Examination-Activity Limitations Bathing;Bed Mobility;Bend;Caring for Others;Carry;Dressing;Lift;Locomotion Level;Squat;Stairs;Stand;Transfers    Examination-Participation Restrictions Cleaning;Community Activity;Driving;Occupation;Yard Work    Stability/Clinical Decision Making Stable/Uncomplicated    Rehab Potential Good    PT Frequency 2x / week    PT Duration 12 weeks    PT Treatment/Interventions ADLs/Self Care Home Management;Cryotherapy;Moist Heat;DME Instruction;Gait training;Stair training;Functional mobility training;Therapeutic activities;Therapeutic exercise;Balance training;Neuromuscular re-education;Patient/family education;Manual techniques;Passive range of motion;Dry needling    PT Next Visit Plan Continue with Balance and progress LE  strengthening activities as appropriate.    PT Home Exercise Plan no changes    Consulted and Agree with Plan of Care Patient;Family member/caregiver;Other (Comment)    Family Member Consulted Wife, Dtr, Interpreter- Milly             Patient will benefit from skilled therapeutic intervention in order to improve the following deficits and impairments:  Abnormal gait, Decreased activity tolerance, Decreased balance, Decreased coordination, Decreased endurance, Decreased mobility, Difficulty walking, Decreased strength  Visit Diagnosis: Abnormality of gait and mobility  Difficulty in walking, not elsewhere classified  Muscle weakness (generalized)  Other lack of coordination     Problem List There are no problems to display for this patient.   Lenda Kelp, PT 04/17/2021, 9:24 AM  [place] MAIN [facility] SERVICES [address] [place], [place], [address] Phone: [phone]   Fax:  [phone]  Name: Dennis Mcintosh MRN: [id number] Date of Birth: 1965-08-10

## 2021-04-18 DIAGNOSIS — E113313 Type 2 diabetes mellitus with moderate nonproliferative diabetic retinopathy with macular edema, bilateral: Secondary | ICD-10-CM | POA: Insufficient documentation

## 2021-04-20 ENCOUNTER — Ambulatory Visit: Payer: Medicaid Other | Admitting: Speech Pathology

## 2021-04-24 ENCOUNTER — Other Ambulatory Visit: Payer: Self-pay

## 2021-04-24 ENCOUNTER — Ambulatory Visit: Payer: Medicaid Other

## 2021-04-24 DIAGNOSIS — M6281 Muscle weakness (generalized): Secondary | ICD-10-CM

## 2021-04-24 DIAGNOSIS — R262 Difficulty in walking, not elsewhere classified: Secondary | ICD-10-CM

## 2021-04-24 DIAGNOSIS — R269 Unspecified abnormalities of gait and mobility: Secondary | ICD-10-CM | POA: Diagnosis not present

## 2021-04-24 DIAGNOSIS — R2681 Unsteadiness on feet: Secondary | ICD-10-CM

## 2021-04-24 NOTE — Therapy (Signed)
[facility] MAIN [facility] SERVICES [address] [place], [place], [address] Phone: [phone]   Fax:  [phone]  Physical Therapy Treatment  Patient Details  Name: Dennis Mcintosh MRN: [id number] Date of Birth: 08/28/1965 Referring Provider (PT): Dr. Windle Guard   Encounter Date: 04/24/2021   PT End of Session - 04/24/21 0827     Visit Number 8    Number of Visits 25    Authorization Time Period Initial Certification 03/02/2021-05/25/2021    PT Start Time 0815    PT Stop Time 0845    PT Time Calculation (min) 30 min    Equipment Utilized During Treatment Gait belt    Activity Tolerance Patient tolerated treatment well    Behavior During Therapy [facility] for tasks assessed/performed             Past Medical History:  Diagnosis Date   Diabetes mellitus without complication (HCC)     Past Surgical History:  Procedure Laterality Date   HEMORRHOID SURGERY      There were no vitals filed for this visit.   Subjective Assessment - 04/24/21 0818     Subjective Patient reports doing well today and denies any changes- No pain or falls. Reports he is compliant with HEP and walking.    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 S/p removal of trach/G tube on 5/27. He most recently presented to ED on 02/26/2021 due to nausea and vomiting. He was at [facility] rehab from 01/31/2021- 02/23/2021.    Limitations Lifting;Standing;Walking;House hold activities    How long can you sit comfortably? no issues    How long can you stand comfortably? < 5 min    How long can you walk comfortably? < 5 min    Patient Stated Goals I want to go back to work    Currently in Pain? No/denies              Interventions:   Therapeutic Exercises:   Patient was  instructed in standing LE exercises with 5lb ankle weights today.   Standing hip march- VC to perform slowly and increase height for max muscle activation.  Standing hip abd Standing hip ext Standing mini squat Standing knee flex Standing calf raise Sit to stand  Patient performed 10-12 reps of each exercise with brief rest break today. Education provided throughout session via VC/TC and demonstration to facilitate movement at target joints and correct muscle activation for all exercises performed.   Gait training:   Patient ambulated approx 180 feet today using SBQC and CGA exhibiting mostly reciprocal steps yet some difficulty with gait sequencing- narrowed step height, width, some scissoring at time with 3-4 loss of balance requiring CGA to maintain balance. Patient stopped due to fatigue.    Clinical Impression: Patient performed well with all standing resistive LE strengthening exercises today. He was also to follow all VC, TC, and visual demo and denied any pain. He present with more left sided weakness and some difficulty completing exercises due to fatigue. He was also able to improve his gait distance but did experience some issues with sequencing and requires close CGA for balance using gait belt. He was again limited in session mostly due to fatigue with gait and therex and will benefit from skilled PT services to address deficits in gait and balance for improved  quality of life, to return to work and decrease fall risk.                          PT Education - 04/24/21 0819     Education Details Exercise technique    Person(s) Educated Patient    Methods Explanation;Demonstration;Tactile cues;Verbal cues    Comprehension Verbalized understanding;Returned demonstration;Verbal cues required;Need further instruction              PT Short Term Goals - 03/02/21 1144       PT SHORT TERM GOAL #1   Title Pt will be independent with HEP in order to improve  strength and balance in order to decrease fall risk and improve function at home and work.    Baseline 03/02/2021- Patient with no formal HEP in place    Time 6    Period Weeks    Status New    Target Date 04/13/21      PT SHORT TERM GOAL #2   Title Patient will demonstrate safe and modified independent sit to stand transfers for improved independence in the home and decreased caregiver assist.    Baseline 03/02/2021- Paitent requires min assist for safe transfers in clinic today.    Time 6    Period Weeks    Status New    Target Date 04/13/21               PT Long Term Goals - 03/02/21 1146       PT LONG TERM GOAL #1   Title Pt will improve FOTO to target score of 73 to display perceived improvements in ability to complete ADL's.    Baseline 03/02/2021= 58%    Time 12    Period Weeks    Status New    Target Date 05/25/21      PT LONG TERM GOAL #2   Title Pt will improve BERG by at least 13 points in order to demonstrate clinically significant improvement in balance.    Baseline 03/02/2021= 8/56    Time 12    Period Weeks    Status New    Target Date 05/25/21      PT LONG TERM GOAL #3   Title Pt will decrease 5TSTS by at least 10 seconds in order to demonstrate clinically significant improvement in LE strength.    Baseline 03/02/2021=36.11 sec with BUE Support    Time 12    Period Weeks    Status New    Target Date 05/25/21      PT LONG TERM GOAL #4   Title Pt will decrease TUG to below 18 seconds/decrease in order to demonstrate decreased fall risk.    Baseline 03/02/2021= 45.14 sec using RW    Time 12    Period Weeks    Status New    Target Date 05/25/21      PT LONG TERM GOAL #5   Title Patient will be modified independent in walking on even/uneven surface with least restrictive assistive device, for 20+ minutes without rest break, reporting some difficulty or less to improve walking tolerance with community ambulation including grocery shopping, going to church,etc.     Baseline 03/02/2021= Patient limited to < 100 feet using RW    Time 12    Period Weeks    Status New    Target Date 05/25/21  Plan - 04/24/21 0827     Clinical Impression Statement Patient performed well with all standing resistive LE strengthening exercises today. He was also to follow all VC, TC, and visual demo and denied any pain. He present with more left sided weakness and some difficulty completing exercises due to fatigue. He was also able to improve his gait distance but did experience some issues with sequencing and requires close CGA for balance using gait belt. He was again limited in session mostly due to fatigue with gait and therex and will benefit from skilled PT services to address deficits in gait and balance for improved quality of life, to return to work and decrease fall risk    Personal Factors and Comorbidities Comorbidity 1    Comorbidities diabetes    Examination-Activity Limitations Bathing;Bed Mobility;Bend;Caring for Others;Carry;Dressing;Lift;Locomotion Level;Squat;Stairs;Stand;Transfers    Examination-Participation Restrictions Cleaning;Community Activity;Driving;Occupation;Yard Work    Stability/Clinical Decision Making Stable/Uncomplicated    Rehab Potential Good    PT Frequency 2x / week    PT Duration 12 weeks    PT Treatment/Interventions ADLs/Self Care Home Management;Cryotherapy;Moist Heat;DME Instruction;Gait training;Stair training;Functional mobility training;Therapeutic activities;Therapeutic exercise;Balance training;Neuromuscular re-education;Patient/family education;Manual techniques;Passive range of motion;Dry needling    PT Next Visit Plan Continue with Balance and progress LE strengthening activities as appropriate.    PT Home Exercise Plan no changes    Consulted and Agree with Plan of Care Patient;Family member/caregiver;Other (Comment)    Family Member Consulted Wife, Dtr, Interpreter- Milly              Patient will benefit from skilled therapeutic intervention in order to improve the following deficits and impairments:  Abnormal gait, Decreased activity tolerance, Decreased balance, Decreased coordination, Decreased endurance, Decreased mobility, Difficulty walking, Decreased strength  Visit Diagnosis: Abnormality of gait and mobility  Difficulty in walking, not elsewhere classified  Muscle weakness (generalized)  Unsteadiness on feet     Problem List There are no problems to display for this patient.   Lenda Kelp, PT 04/25/2021, 6:33 AM  [place] [facility] MAIN [facility] SERVICES [address] [place], [place], [address] Phone: [phone]   Fax:  [phone]  Name: Dennis Mcintosh MRN: [id number] Date of Birth: Dec 17, 1965

## 2021-04-27 ENCOUNTER — Ambulatory Visit: Payer: Medicaid Other | Admitting: Speech Pathology

## 2021-04-27 ENCOUNTER — Other Ambulatory Visit: Payer: Self-pay

## 2021-04-27 DIAGNOSIS — R269 Unspecified abnormalities of gait and mobility: Secondary | ICD-10-CM | POA: Diagnosis not present

## 2021-04-27 DIAGNOSIS — R490 Dysphonia: Secondary | ICD-10-CM

## 2021-04-27 NOTE — Therapy (Addendum)
[facility] MAIN [facility] SERVICES [address] [place], [place], [address] Phone: [phone]   Fax:  [phone]  Speech Language Pathology Evaluation  Patient Details  Name: Dennis Mcintosh MRN: [id number] Date of Birth: 01-31-1966 Referring Provider (SLP): Windle Guard  Encounter Date: 04/27/2021   End of Session - 04/30/21 0759     Visit Number 1    Number of Visits 1    Date for SLP Re-Evaluation 07/23/21    Authorization Type Medicaid    Authorization Time Period 04/27/2021 thru 07/23/2021    Authorization - Visit Number 1    Progress Note Due on Visit 10    SLP Start Time 0910    SLP Stop Time  1000    SLP Time Calculation (min) 50 min    Activity Tolerance Patient tolerated treatment well             Past Medical History:  Diagnosis Date   Diabetes mellitus without complication (HCC)     Past Surgical History:  Procedure Laterality Date   HEMORRHOID SURGERY      There were no vitals filed for this visit.        SLP Evaluation [facility] - 04/30/21 0001       SLP Visit Information   SLP Received On 04/27/21    Referring Provider (SLP) Windle Guard    Onset Date 12/06/2020    Medical Diagnosis Dysphonia      General Information   HPI Dennis Mcintosh is a 55 y.o. 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 Pt intubated for 13 days prior to trach. Pt attended inpatient rehab (01/31/2021 thru 02/23/2021) and has seen been decannulated with PEG removed.   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   Referral made to Speech Therapy on 03/28/2021 by neurology for "hoarse, soft voice."     Behavioral/Cognition appropriate    Mobility Status arrived in Staxxi Chair      Prior Functional Status   Cognitive/Linguistic Baseline Within functional limits      Cognition   Overall Cognitive Status Within Functional Limits for tasks assessed      Auditory Comprehension   Overall Auditory Comprehension Appears within functional limits for tasks assessed      Expression   Primary Mode of Expression Verbal      Verbal Expression   Overall Verbal Expression Appears within functional limits for tasks assessed      Oral Motor/Sensory Function   Overall Oral Motor/Sensory Function Appears within functional limits for tasks assessed      Motor Speech   Overall Motor Speech Impaired    Respiration Impaired    Level of Impairment Word    Phonation Breathy;Hoarse;Low vocal intensity;Aphonic    Resonance Within functional limits    Articulation Within functional limits    Intelligibility Intelligibility reduced    Word 75-100% accurate    Phrase 50-74% accurate  Sentence 50-74% accurate    Conversation 50-74% accurate    Motor Planning Within functional limits    Motor Speech Errors Not applicable    Interfering Components Hearing loss    Phonation Impaired    Volume Soft;Decibel Level    Pitch Aphonic      Standardized Assessments   Standardized Assessments  --   Perceptual Voice Evaluation                            SLP Education - 04/30/21 0758     Education Details Will need ENT evaluation prior to start of ST services, results of Voice Evaluation today    Person(s) Educated Patient;Spouse    Methods Explanation;Demonstration;Verbal cues    Comprehension Verbalized understanding;Need further  instruction              SLP Short Term Goals - 04/30/21 0811       SLP SHORT TERM GOAL #1   Title The patient will maximize voice quality and loudness using breath support for sustained vowel production, pitch glides, and hierarchal speech drill    Baseline severe impairments ~ 1 word per breath    Time 5    Period Weeks    Status New    Target Date 06/04/21      SLP SHORT TERM GOAL #2   Title The patient will eliminate phonotraumatic behaviors such as chronic throat clearing, by substituting non-traumatic methods to clear mucus.    Baseline moderate severity    Time 5    Period Weeks    Status New    Target Date 06/04/21              SLP Long Term Goals - 04/30/21 0813       SLP LONG TERM GOAL #1   Title Pt will improve vocal quality, vocal hygiene, voice projection, and prevent vocal fatigue.    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 Pt speaks ~ 1 word per breath - he states "I don't have breath to get voice completely out." He also presents with habitual throat clears and describes intermittent coughing when consuming thin liquids. Vocal quality has remained the same since hospitalization but worsening throughout the day. Overall speech intelligibility is reduced to ~50% at the phrase level.   While pt may benefit from ST services, he must be evaluated and cleared for voice therapy by ENT. Results of this evaluation and referral to ENT were discussed with pt and his wife.    Speech Therapy Frequency Other (comment)   services placed on hold, awaiting results of ENT evaluation    Duration 12 weeks    Treatment/Interventions SLP instruction and feedback;Patient/family education    Potential to Achieve Goals Fair    Potential Considerations Other (comment)   post intubation   Consulted and Agree with Plan of Care Patient;Family member/caregiver    Family Member Consulted pt's wife  Patient will benefit from skilled therapeutic intervention in order to improve the following deficits and impairments:   Dysphonia    Problem List There are no problems to display for this patient.  Dennis Mcintosh B. Dennis Mcintosh M.S., CCC-SLP, [facility] Speech-Language Pathologist Rehabilitation Services Office [phone]  Dennis Mcintosh 04/30/2021, 8:15 AM  [place] [facility] MAIN [facility] SERVICES [address] [place], [place], [address] Phone: [phone]   Fax:  [phone]  Name: Dennis Mcintosh MRN: [id number] Date of Birth: [birth year]

## 2021-04-30 NOTE — Addendum Note (Signed)
Addended by: Reuel Derby B on: 04/30/2021 08:17 AM   Modules accepted: Orders

## 2021-05-01 ENCOUNTER — Ambulatory Visit: Payer: Medicaid Other | Attending: Physical Medicine and Rehabilitation

## 2021-05-01 ENCOUNTER — Other Ambulatory Visit: Payer: Self-pay

## 2021-05-01 DIAGNOSIS — M6281 Muscle weakness (generalized): Secondary | ICD-10-CM | POA: Insufficient documentation

## 2021-05-01 DIAGNOSIS — R269 Unspecified abnormalities of gait and mobility: Secondary | ICD-10-CM | POA: Insufficient documentation

## 2021-05-01 DIAGNOSIS — R2689 Other abnormalities of gait and mobility: Secondary | ICD-10-CM | POA: Diagnosis present

## 2021-05-01 DIAGNOSIS — R262 Difficulty in walking, not elsewhere classified: Secondary | ICD-10-CM | POA: Insufficient documentation

## 2021-05-01 DIAGNOSIS — R2681 Unsteadiness on feet: Secondary | ICD-10-CM | POA: Insufficient documentation

## 2021-05-01 NOTE — Therapy (Signed)
[facility] MAIN [facility] SERVICES [address] [place], [place], [address] Phone: [phone]   Fax:  [phone]  Physical Therapy Treatment  Patient Details  Name: Dennis Mcintosh MRN: [id number] Date of Birth: [birth year] Referring Provider (PT): Dr. Windle Guard   Encounter Date: 05/01/2021   PT End of Session - 05/01/21 0834     Visit Number 9    Number of Visits 25    Authorization Time Period Initial Certification 03/02/2021-05/25/2021    PT Start Time 0811    PT Stop Time 0850    PT Time Calculation (min) 39 min    Equipment Utilized During Treatment Gait belt    Activity Tolerance Patient tolerated treatment well    Behavior During Therapy [facility] for tasks assessed/performed             Past Medical History:  Diagnosis Date   Diabetes mellitus without complication (HCC)     Past Surgical History:  Procedure Laterality Date   HEMORRHOID SURGERY      There were no vitals filed for this visit.   Subjective Assessment - 05/01/21 0833     Subjective Patient reports doing ok today and reports he wants to continue to work on his walking as he feels this is his biggest issue.    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 S/p removal of trach/G tube on 5/27. He most recently presented to ED on 02/26/2021 due to nausea and vomiting. He was at [facility] rehab from 01/31/2021- 02/23/2021.    Limitations Lifting;Standing;Walking;House hold activities    How long can you sit comfortably? no issues    How long can you stand comfortably? < 5 min    How long can you walk comfortably? < 5 min    Patient Stated Goals I want to go back to work    Currently in Pain? No/denies            *Interpreter- Larena Sox present for today's  session.  Interventions:   Gait training: Biodex treadmill at 1.1 mph with B UE support x 5 min.  (CGA and increased VC and visual demo for safety - using emergency stops, Cues to keep moving while belt is moving, VC for posture and for increased step length.   *Reuel Derby, SLP came over to discuss Speech schedule/plan while interpreter and wife present.   Neuromuscular re-ed:   Standing at balance station with 1UE holding railing and the other holding onto quad cane- Patient performed heel to toe gait sequencing (with hedgehog placed at each position for reference) x 20 reps each. Patient with initial difficulty coordinating movement and increasing his step length yet did improve with practice.   Side step over hedgehog with 1 UE support x 25 reps - Difficulty with coordinating activity and requiring VC for safe technique.   Static stand on balance pad- hold 20 sec with eyes open, 10 sec x 4 with eyes closed; 5 horizontal head turns and vertical head turning - Increased loss of balance - mostly posterior with any activity with eyes closed.   Dynamic marching x 20 rep with 1 UE support - no loss of balance and then attempted 15 reps without UE support - requiring CGA and increased unsteadiness particularly with standing on right LE.  Education provided throughout session via VC/TC and demonstration to  facilitate movement at target joints and correct muscle activation for all testing and exercises performed.   Clinical Impression: Patient challenged today with gait on Treadmill as he has never used before. He did respond well to VC and able to increase step length and gained confidence on TM with practice. He also improved with reps on most balance exercises but remains very visually preferenced with dynamic balance exercises. Patient will benefit from skilled PT services to address deficits in gait and balance for improved quality of life, to return to work and decrease fall  risk                        PT Education - 05/01/21 0834     Education Details Education on safe mobility on Treadmill    Person(s) Educated Patient    Methods Explanation;Demonstration;Tactile cues;Verbal cues    Comprehension Verbalized understanding;Returned demonstration;Verbal cues required;Need further instruction;Tactile cues required              PT Short Term Goals - 03/02/21 1144       PT SHORT TERM GOAL #1   Title Pt will be independent with HEP in order to improve strength and balance in order to decrease fall risk and improve function at home and work.    Baseline 03/02/2021- Patient with no formal HEP in place    Time 6    Period Weeks    Status New    Target Date 04/13/21      PT SHORT TERM GOAL #2   Title Patient will demonstrate safe and modified independent sit to stand transfers for improved independence in the home and decreased caregiver assist.    Baseline 03/02/2021- Paitent requires min assist for safe transfers in clinic today.    Time 6    Period Weeks    Status New    Target Date 04/13/21               PT Long Term Goals - 03/02/21 1146       PT LONG TERM GOAL #1   Title Pt will improve FOTO to target score of 73 to display perceived improvements in ability to complete ADL's.    Baseline 03/02/2021= 58%    Time 12    Period Weeks    Status New    Target Date 05/25/21      PT LONG TERM GOAL #2   Title Pt will improve BERG by at least 13 points in order to demonstrate clinically significant improvement in balance.    Baseline 03/02/2021= 8/56    Time 12    Period Weeks    Status New    Target Date 05/25/21      PT LONG TERM GOAL #3   Title Pt will decrease 5TSTS by at least 10 seconds in order to demonstrate clinically significant improvement in LE strength.    Baseline 03/02/2021=36.11 sec with BUE Support    Time 12    Period Weeks    Status New    Target Date 05/25/21      PT LONG TERM GOAL #4   Title Pt will  decrease TUG to below 18 seconds/decrease in order to demonstrate decreased fall risk.    Baseline 03/02/2021= 45.14 sec using RW    Time 12    Period Weeks    Status New    Target Date 05/25/21      PT LONG TERM GOAL #5   Title Patient will  be modified independent in walking on even/uneven surface with least restrictive assistive device, for 20+ minutes without rest break, reporting some difficulty or less to improve walking tolerance with community ambulation including grocery shopping, going to church,etc.    Baseline 03/02/2021= Patient limited to < 100 feet using RW    Time 12    Period Weeks    Status New    Target Date 05/25/21                   Plan - 05/01/21 0835     Clinical Impression Statement Patient challenged today with gait on Treadmill as he has never used before. He did respond well to VC and able to increase step length and gained confidence on TM with practice. He also improved with reps on most balance exercises but remains very visually preferenced with dynamic balance exercises. Patient will benefit from skilled PT services to address deficits in gait and balance for improved quality of life, to return to work and decrease fall risk    Personal Factors and Comorbidities Comorbidity 1    Comorbidities diabetes    Examination-Activity Limitations Bathing;Bed Mobility;Bend;Caring for Others;Carry;Dressing;Lift;Locomotion Level;Squat;Stairs;Stand;Transfers    Examination-Participation Restrictions Cleaning;Community Activity;Driving;Occupation;Yard Work    Stability/Clinical Decision Making Stable/Uncomplicated    Rehab Potential Good    PT Frequency 2x / week    PT Duration 12 weeks    PT Treatment/Interventions ADLs/Self Care Home Management;Cryotherapy;Moist Heat;DME Instruction;Gait training;Stair training;Functional mobility training;Therapeutic activities;Therapeutic exercise;Balance training;Neuromuscular re-education;Patient/family education;Manual  techniques;Passive range of motion;Dry needling    PT Next Visit Plan Continue with Balance and progress LE strengthening activities as appropriate.    PT Home Exercise Plan no changes    Consulted and Agree with Plan of Care Patient;Family member/caregiver;Other (Comment)    Family Member Consulted Wife             Patient will benefit from skilled therapeutic intervention in order to improve the following deficits and impairments:  Abnormal gait, Decreased activity tolerance, Decreased balance, Decreased coordination, Decreased endurance, Decreased mobility, Difficulty walking, Decreased strength  Visit Diagnosis: Abnormality of gait and mobility  Difficulty in walking, not elsewhere classified  Muscle weakness (generalized)  Unsteadiness on feet     Problem List There are no problems to display for this patient.   Lenda Kelp, PT 05/01/2021, 9:14 AM  [place] [facility] MAIN [facility] SERVICES [address] [place], [place], [address] Phone: [phone]   Fax:  [phone]  Name: Shamarion Coots MRN: [id number] Date of Birth: 1965-09-25

## 2021-05-04 ENCOUNTER — Ambulatory Visit: Payer: Medicaid Other | Admitting: Speech Pathology

## 2021-05-08 ENCOUNTER — Other Ambulatory Visit: Payer: Self-pay

## 2021-05-08 ENCOUNTER — Ambulatory Visit: Payer: Medicaid Other

## 2021-05-08 DIAGNOSIS — M6281 Muscle weakness (generalized): Secondary | ICD-10-CM

## 2021-05-08 DIAGNOSIS — R2681 Unsteadiness on feet: Secondary | ICD-10-CM

## 2021-05-08 DIAGNOSIS — R262 Difficulty in walking, not elsewhere classified: Secondary | ICD-10-CM

## 2021-05-08 DIAGNOSIS — R269 Unspecified abnormalities of gait and mobility: Secondary | ICD-10-CM

## 2021-05-08 NOTE — Therapy (Signed)
[facility] MAIN [facility] SERVICES [address] [place], [place], [address] Phone: [phone]   Fax:  [phone]  Physical Therapy Treatment/Physical Therapy Progress Note   Dates of reporting period  03/02/2021   to  05/08/2021  Patient Details  Name: Dennis Mcintosh MRN: [id number] Date of Birth: May 16, 1966 Referring Provider (PT): Dr. Windle Guard   Encounter Date: 05/08/2021   PT End of Session - 05/08/21 1010     Visit Number 10    Number of Visits 25    Authorization Time Period Initial Certification 03/02/2021-05/25/2021    PT Start Time 0814    PT Stop Time 0848    PT Time Calculation (min) 34 min    Equipment Utilized During Treatment Gait belt    Activity Tolerance Patient tolerated treatment well    Behavior During Therapy [facility] for tasks assessed/performed             Past Medical History:  Diagnosis Date   Diabetes mellitus without complication (HCC)     Past Surgical History:  Procedure Laterality Date   HEMORRHOID SURGERY      There were no vitals filed for this visit.   Subjective Assessment - 05/08/21 1008     Subjective Patient reports doing ok today and denies any falls or pain. He reports he has continued to work on his walking with his cane.    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 S/p removal of trach/G tube on 5/27. He most recently presented to ED on 02/26/2021 due to nausea and vomiting. He was at [facility] rehab from 01/31/2021- 02/23/2021.    Limitations Lifting;Standing;Walking;House hold activities    How long can you sit comfortably? no issues    How long can you stand comfortably? < 5 min    How long can you walk comfortably? < 5 min    Patient Stated Goals I want to go back to work    Currently in  Pain? No/denies              INTERVENTIONS:    Reassessed goals- STG-   Patient demonstrated good understanding of current HEP including walking with cane and LE strengthening exercises.  Patient also demonstrated modified ind with all sit to stand transfers using BUE and a quad cane without difficulty.   Neuromuscular re-education:   Gait with use of quad cane- negotiating around 8 cones- mild difficulty coordinating around cones with 3 episodes of loss of balance - requiring min assist to keep from falling- he did improve after performing 3 trials.   Gait- side stepping using quad cane approx 8 feet x 4 to left and then to right- Mild unsteadiness with VC for step length.   Gait- Forward/retro using cane - 10 feet forward then back forcusing on sequencing with cane and increasing step length. Patient required more cues for retro gait including taking a longer step and sequencing with cane.    Step up/down using quad cane- CGA with VC for proper sequencing- Patient performed 4 times with good technique and only uisng cane- No rails and no LOB.   Sit to stand from edge of mat (22" high) x 10 reps (VC to lean forward and not use back of legs to push against edge of mat for assistance. He did improve with practice.   Standing  mini lunge at support bar- 12 reps each LE- Repetitive VC to perform correctly.   Standing Calf raises- Standing on 1/2 foam roll - 12 reps BLE- hold for 2 sec - Patient able to follow all VC and visual cues for correct technique.   Education provided throughout session via VC/TC and demonstration to facilitate movement at target joints and correct muscle activation for all texercises performed.    Clinical Impression: Patient experienced difficulty with sequencing with quad cane around cones and required min assist to keep from falling. He did improve his coordination with practice. He was also challenged with LE strengthening exercises requiring repetitive  VC and visual demo to perform safely and correctly. Discussed at length about arriving on time to optimize his rehab experience. Will work on trying to reschedule him for later in day if possible. Patient's condition has the potential to improve in response to therapy. Maximum improvement is yet to be obtained. The anticipated improvement is attainable and reasonable in a generally predictable time.                PT Education - 05/08/21 1009     Education Details Safety with use of cane with walking; Exercise technique    Person(s) Educated Patient    Methods Explanation;Demonstration;Tactile cues;Verbal cues    Comprehension Verbalized understanding;Returned demonstration;Verbal cues required;Need further instruction              PT Short Term Goals - 05/08/21 1017       PT SHORT TERM GOAL #1   Title Pt will be independent with HEP in order to improve strength and balance in order to decrease fall risk and improve function at home and work.    Baseline 03/02/2021- Patient with no formal HEP in place; 05/08/2021- Patient able to verbalize good understanding of current HEP involving strengthening and mobility including ambulation with cane.    Time 6    Period Weeks    Status Achieved    Target Date 04/13/21      PT SHORT TERM GOAL #2   Title Patient will demonstrate safe and modified independent sit to stand transfers for improved independence in the home and decreased caregiver assist.    Baseline 03/02/2021- Paitent requires min assist for safe transfers in clinic today; 05/08/2021= Patient able to demonstrate modified ind with all sit to stand transfers using BUE and a quad cane without difficulty.    Time 6    Period Weeks    Status Achieved    Target Date 04/13/21               PT Long Term Goals - 03/02/21 1146       PT LONG TERM GOAL #1   Title Pt will improve FOTO to target score of 73 to display perceived improvements in ability to complete ADL's.     Baseline 03/02/2021= 58%    Time 12    Period Weeks    Status New    Target Date 05/25/21      PT LONG TERM GOAL #2   Title Pt will improve BERG by at least 13 points in order to demonstrate clinically significant improvement in balance.    Baseline 03/02/2021= 8/56    Time 12    Period Weeks    Status New    Target Date 05/25/21      PT LONG TERM GOAL #3   Title Pt will decrease 5TSTS by at least 10 seconds in order to demonstrate  clinically significant improvement in LE strength.    Baseline 03/02/2021=36.11 sec with BUE Support    Time 12    Period Weeks    Status New    Target Date 05/25/21      PT LONG TERM GOAL #4   Title Pt will decrease TUG to below 18 seconds/decrease in order to demonstrate decreased fall risk.    Baseline 03/02/2021= 45.14 sec using RW    Time 12    Period Weeks    Status New    Target Date 05/25/21      PT LONG TERM GOAL #5   Title Patient will be modified independent in walking on even/uneven surface with least restrictive assistive device, for 20+ minutes without rest break, reporting some difficulty or less to improve walking tolerance with community ambulation including grocery shopping, going to church,etc.    Baseline 03/02/2021= Patient limited to < 100 feet using RW    Time 12    Period Weeks    Status New    Target Date 05/25/21                   Plan - 05/08/21 1010     Clinical Impression Statement Patient experienced difficulty with sequencing with quad cane around cones and required min assist to keep from falling. He did improve his coordination with practice. He was also challenged with LE strengthening exercises requiring repetitive VC and visual demo to perform safely and correctly. Discussed at length about arriving on time to optimize his rehab experience. Will work on trying to reschedule him for later in day if possible. Patient's condition has the potential to improve in response to therapy. Maximum improvement is yet to be  obtained. The anticipated improvement is attainable and reasonable in a generally predictable time    Personal Factors and Comorbidities Comorbidity 1    Comorbidities diabetes    Examination-Activity Limitations Bathing;Bed Mobility;Bend;Caring for Others;Carry;Dressing;Lift;Locomotion Level;Squat;Stairs;Stand;Transfers    Examination-Participation Restrictions Cleaning;Community Activity;Driving;Occupation;Yard Work    Stability/Clinical Decision Making Stable/Uncomplicated    Rehab Potential Good    PT Frequency 2x / week    PT Duration 12 weeks    PT Treatment/Interventions ADLs/Self Care Home Management;Cryotherapy;Moist Heat;DME Instruction;Gait training;Stair training;Functional mobility training;Therapeutic activities;Therapeutic exercise;Balance training;Neuromuscular re-education;Patient/family education;Manual techniques;Passive range of motion;Dry needling    PT Next Visit Plan Continue with Balance and progress LE strengthening activities as appropriate.    PT Home Exercise Plan no changes    Consulted and Agree with Plan of Care Patient;Family member/caregiver;Other (Comment)    Family Member Consulted Wife             Patient will benefit from skilled therapeutic intervention in order to improve the following deficits and impairments:  Abnormal gait, Decreased activity tolerance, Decreased balance, Decreased coordination, Decreased endurance, Decreased mobility, Difficulty walking, Decreased strength  Visit Diagnosis: Abnormality of gait and mobility  Difficulty in walking, not elsewhere classified  Muscle weakness (generalized)  Unsteadiness on feet     Problem List There are no problems to display for this patient.   Lenda Kelp, PT 05/08/2021, 10:45 AM  [place] [facility] MAIN [facility] SERVICES [address] [place], [place], [address] Phone: [phone]   Fax:  [phone]  Name: Dennis Mcintosh MRN:  [id number] Date of Birth: 1965-10-01

## 2021-05-10 ENCOUNTER — Telehealth: Payer: Self-pay | Admitting: Speech Pathology

## 2021-05-11 ENCOUNTER — Ambulatory Visit: Payer: Medicaid Other | Admitting: Speech Pathology

## 2021-05-15 ENCOUNTER — Ambulatory Visit: Payer: Medicaid Other | Admitting: Physical Therapy

## 2021-05-15 ENCOUNTER — Encounter: Payer: Medicaid Other | Admitting: Speech Pathology

## 2021-05-16 ENCOUNTER — Other Ambulatory Visit: Payer: Self-pay

## 2021-05-16 ENCOUNTER — Ambulatory Visit: Payer: Medicaid Other | Admitting: Physical Therapy

## 2021-05-16 DIAGNOSIS — M6281 Muscle weakness (generalized): Secondary | ICD-10-CM

## 2021-05-16 DIAGNOSIS — R2681 Unsteadiness on feet: Secondary | ICD-10-CM

## 2021-05-16 DIAGNOSIS — R269 Unspecified abnormalities of gait and mobility: Secondary | ICD-10-CM

## 2021-05-16 DIAGNOSIS — R262 Difficulty in walking, not elsewhere classified: Secondary | ICD-10-CM

## 2021-05-16 DIAGNOSIS — R2689 Other abnormalities of gait and mobility: Secondary | ICD-10-CM

## 2021-05-16 NOTE — Therapy (Signed)
[facility] MAIN [facility] SERVICES [address] [place], [place], [address] Phone: [phone]   Fax:  [phone]  Physical Therapy Treatment  Patient Details  Name: Dennis Mcintosh MRN: [id number] Date of Birth: April 02, 1966 Referring Provider (PT): Dr. Windle Guard   Encounter Date: 05/16/2021   PT End of Session - 05/16/21 1533     Visit Number 11    Number of Visits 25    Authorization Time Period Initial Certification 03/02/2021-05/25/2021    Progress Note Due on Visit 20    PT Start Time 1345    PT Stop Time 1429    PT Time Calculation (min) 44 min    Equipment Utilized During Treatment Gait belt    Activity Tolerance Patient tolerated treatment well    Behavior During Therapy [facility] for tasks assessed/performed             Past Medical History:  Diagnosis Date   Diabetes mellitus without complication (HCC)     Past Surgical History:  Procedure Laterality Date   HEMORRHOID SURGERY      There were no vitals filed for this visit.   Subjective Assessment - 05/16/21 1351     Subjective Pt rpeorts no changes since last visit. Also reports no pain at this time.    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 S/p removal of trach/G tube on 5/27. He most recently presented to ED on 02/26/2021 due to nausea and vomiting. He was at [facility] rehab from 01/31/2021- 02/23/2021.    Limitations Lifting;Standing;Walking;House hold activities    How long can you sit comfortably? no issues    How long can you stand comfortably? < 5 min    How long can you walk comfortably? < 5 min    Patient Stated Goals I want to go back to work    Currently in Pain? No/denies              Treatment provided this session     Neuro Re- Ed:   Airex stance with self selected foot placement 1 x 1  min looking straight ahead 1 x 1 minute with lateral head turns 1 x 1 minute with head nods  - all rated moderate difficulty, PT noted most sway and single Lob episode with lateral head turns but no significant sway with other conditions   Unless otherwise stated, CGA was provided and gait belt donned in order to ensure pt safety  Therex:   Biodex TM- gait trainer using UE support and gait belt-  Step length= _.48_ m RLE__.47 m L Time on each foot _50_%R/_50_%L Gait velocity= .49m/s Total Time on TM = 5 min  Patient cued throughout in order to allow larger step length and also to stand up straighter and put less weight through upper extremities on treadmill bars.   Sidestepping over blue half foam roll.  X10 to each side.  Cues to prevent excessive weight with upper extremities.        There Act:  Gait training with quad cane in order to improve ambulation sufficient and complete more functional activities around his home -cues for sequencing, pt tends to step with contralateral LE prior to moving cane, verbal and visual demonstration and teach back method utilized to ensure pt understanding  Improved with second repetition, on first repetition patient unable to sequence cane lower extremity correctly  displayed significant sway and instability when not utilizing quad cane properly. -Significant fatigue noted following each ambulatory bout.    Pt educated throughout session about proper posture and technique with exercises. Improved exercise technique, movement at target joints, use of target muscles after min to mod verbal, visual, tactile cues.  Patient also educated to perform more walking activity around his house in a safe environment utilizing either walker or quad cane with correct technique.  Patient verbalized understanding and interpreter reported all conversations in therapy session today.  Pt required occasional rest breaks due fatigue, PT was quick to ask when pt  appeared to be fatiguing in order to prevent excessive fatigue.   Maritza interpreter present for appointment   Note: Portions of this document were prepared using Dragon voice recognition software and although reviewed may contain unintentional dictation errors in syntax, grammar, or spelling.                        PT Education - 05/16/21 1532     Education Details Proper utilization of cane when walking    Person(s) Educated Patient;Spouse    Methods Explanation;Demonstration;Tactile cues;Verbal cues    Comprehension Verbalized understanding;Returned demonstration;Verbal cues required;Tactile cues required;Need further instruction              PT Short Term Goals - 05/08/21 1017       PT SHORT TERM GOAL #1   Title Pt will be independent with HEP in order to improve strength and balance in order to decrease fall risk and improve function at home and work.    Baseline 03/02/2021- Patient with no formal HEP in place; 05/08/2021- Patient able to verbalize good understanding of current HEP involving strengthening and mobility including ambulation with cane.    Time 6    Period Weeks    Status Achieved    Target Date 04/13/21      PT SHORT TERM GOAL #2   Title Patient will demonstrate safe and modified independent sit to stand transfers for improved independence in the home and decreased caregiver assist.    Baseline 03/02/2021- Paitent requires min assist for safe transfers in clinic today; 05/08/2021= Patient able to demonstrate modified ind with all sit to stand transfers using BUE and a quad cane without difficulty.    Time 6    Period Weeks    Status Achieved    Target Date 04/13/21               PT Long Term Goals - 03/02/21 1146       PT LONG TERM GOAL #1   Title Pt will improve FOTO to target score of 73 to display perceived improvements in ability to complete ADL's.    Baseline 03/02/2021= 58%    Time 12    Period Weeks    Status New     Target Date 05/25/21      PT LONG TERM GOAL #2   Title Pt will improve BERG by at least 13 points in order to demonstrate clinically significant improvement in balance.    Baseline 03/02/2021= 8/56    Time 12    Period Weeks    Status New    Target Date 05/25/21      PT LONG TERM GOAL #3   Title Pt will decrease 5TSTS by at least 10 seconds in order to demonstrate clinically significant improvement in LE strength.    Baseline 03/02/2021=36.11 sec with BUE Support  Time 12    Period Weeks    Status New    Target Date 05/25/21      PT LONG TERM GOAL #4   Title Pt will decrease TUG to below 18 seconds/decrease in order to demonstrate decreased fall risk.    Baseline 03/02/2021= 45.14 sec using RW    Time 12    Period Weeks    Status New    Target Date 05/25/21      PT LONG TERM GOAL #5   Title Patient will be modified independent in walking on even/uneven surface with least restrictive assistive device, for 20+ minutes without rest break, reporting some difficulty or less to improve walking tolerance with community ambulation including grocery shopping, going to church,etc.    Baseline 03/02/2021= Patient limited to < 100 feet using RW    Time 12    Period Weeks    Status New    Target Date 05/25/21                   Plan - 05/16/21 1544     Clinical Impression Statement Patient continues to demonstrate difficulty with sequencing quad cane and ambulation around clinic.  Patient did improve coordination with practice as well as with verbal demonstration and return demonstration from patient.  Patient does improve when he is asked to slow down and make sure he gets his sequencing correct.  Patient did present to therapy on time to session and allow for full utilization of physical therapy treatment time.  Patient will continue to benefit from skilled physical therapy services in order to improve his ambulatory capacity, balance, overall function, and improve his overall quality of  life.    Personal Factors and Comorbidities Comorbidity 1    Comorbidities diabetes    Examination-Activity Limitations Bathing;Bed Mobility;Bend;Caring for Others;Carry;Dressing;Lift;Locomotion Level;Squat;Stairs;Stand;Transfers    Examination-Participation Restrictions Cleaning;Community Activity;Driving;Occupation;Yard Work    Stability/Clinical Decision Making Stable/Uncomplicated    Rehab Potential Good    PT Frequency 2x / week    PT Duration 12 weeks    PT Treatment/Interventions ADLs/Self Care Home Management;Cryotherapy;Moist Heat;DME Instruction;Gait training;Stair training;Functional mobility training;Therapeutic activities;Therapeutic exercise;Balance training;Neuromuscular re-education;Patient/family education;Manual techniques;Passive range of motion;Dry needling    PT Next Visit Plan Continue with Balance and progress LE strengthening activities as appropriate.    PT Home Exercise Plan no changes    Consulted and Agree with Plan of Care Patient;Family member/caregiver;Other (Comment)    Family Member Consulted Wife             Patient will benefit from skilled therapeutic intervention in order to improve the following deficits and impairments:  Abnormal gait, Decreased activity tolerance, Decreased balance, Decreased coordination, Decreased endurance, Decreased mobility, Difficulty walking, Decreased strength  Visit Diagnosis: Abnormality of gait and mobility  Difficulty in walking, not elsewhere classified  Muscle weakness (generalized)  Unsteadiness on feet  Other abnormalities of gait and mobility     Problem List There are no problems to display for this patient.   Norman Herrlich, PT 05/16/2021, 3:48 PM  [facility] MAIN [facility] SERVICES [address] [place], [place], [address] Phone: [phone]   Fax:  [phone]  Name: Keidrick Murty MRN: [id number] Date of Birth: 01/11/1966

## 2021-05-18 ENCOUNTER — Encounter: Payer: Medicaid Other | Admitting: Speech Pathology

## 2021-05-22 ENCOUNTER — Ambulatory Visit: Payer: Medicaid Other

## 2021-05-22 ENCOUNTER — Other Ambulatory Visit: Payer: Self-pay

## 2021-05-22 DIAGNOSIS — R269 Unspecified abnormalities of gait and mobility: Secondary | ICD-10-CM

## 2021-05-22 DIAGNOSIS — R262 Difficulty in walking, not elsewhere classified: Secondary | ICD-10-CM

## 2021-05-22 DIAGNOSIS — M6281 Muscle weakness (generalized): Secondary | ICD-10-CM

## 2021-05-22 DIAGNOSIS — R2681 Unsteadiness on feet: Secondary | ICD-10-CM

## 2021-05-22 NOTE — Therapy (Signed)
[facility] MAIN [facility] SERVICES [address] [place], [place], [address] Phone: [phone]   Fax:  [phone]  Physical Therapy Treatment  Patient Details  Name: Dennis Mcintosh MRN: [id number] Date of Birth: Jun 05, 1966 Referring Provider (PT): Dr. Windle Guard   Encounter Date: 05/22/2021   PT End of Session - 05/22/21 1112     Visit Number 12    Number of Visits 25    Date for PT Re-Evaluation 05/25/21    Authorization Time Period Initial Certification 03/02/2021-05/25/2021    Progress Note Due on Visit 20    PT Start Time 1015    PT Stop Time 1058    PT Time Calculation (min) 43 min    Equipment Utilized During Treatment Gait belt    Activity Tolerance Patient tolerated treatment well    Behavior During Therapy WFL for tasks assessed/performed             Past Medical History:  Diagnosis Date   Diabetes mellitus without complication (HCC)     Past Surgical History:  Procedure Laterality Date   HEMORRHOID SURGERY      There were no vitals filed for this visit.   Subjective Assessment - 05/22/21 1109     Subjective Pt rpeorts no new news. States he has been walking with his quad cane at home some and no falls.    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 S/p removal of trach/G tube on 5/27. He most recently presented to ED on 02/26/2021 due to nausea and vomiting. He was at [facility] rehab from 01/31/2021- 02/23/2021.    Limitations Lifting;Standing;Walking;House hold activities    How long can you sit comfortably? no issues    How long can you stand comfortably? < 5 min    How long can you walk comfortably? < 5 min    Patient Stated Goals I want to go back to work    Currently in Pain? No/denies             INTERVENTIONS:   *Interpreter- Annice Pih present for  today's session.   Gait training:  Patient was observed walking in hallway approx 150 feet using small based QC and exhibiting poor point contact- only making 2 point contact versus 4. Reviewed proper use of gait with QC then introduced walking with a SPC cane. Patient ambulated another 150 feet with similar gait sequencing and some unsteadiness yet no loss of balance. He reported that he preferred the [facility] over the QC but requiring CGA with both.   Gait trainer on Biodex TM   Biodex TM- gait trainer using UE support and gait belt-  Step length= _.42_ m RLE__.49 m L Time on each foot _46_%R/_54_%L Gait velocity= .42 m/s Total Time on TM = 4 min  Attempted only single UE support (RUE) yet patient only able to perform around 1 min then requested to use both hands. Patient cued throughout in order to allow larger step length and also to stand up straighter and put less weight through upper extremities on treadmill bars.  Gait step length activity- positioned 2 hedgehogs  (one for heel strike and one for toe off) and instructed patient to step using only 1 hand on bar to focus on taking a longer step- Patient able to perform x 30 each leg with increased step length.   Forward/retro steps  with  1 UE support - with improved step length overall after step length activity- down and back x 10 reps.   Clinical Impression: Patient with some unsteadiness with each type of cane although he did improve with VC and practice. He was motivated to attempt all gait and balance activities and responsive to VC for improving his step length for more symmetrical steps. He was able to improve with step length overall after much practice. Patient will continue to benefit from skilled physical therapy services in order to improve his ambulatory capacity, balance, overall function, and improve his overall quality of life                        PT Education - 05/22/21 1110     Education Details Gait  sequencing with QC and SPC    Person(s) Educated Patient;Other (comment)   Interpreter- Annice Pih   Methods Explanation;Demonstration;Tactile cues;Verbal cues    Comprehension Verbalized understanding;Returned demonstration;Verbal cues required;Need further instruction;Tactile cues required              PT Short Term Goals - 05/08/21 1017       PT SHORT TERM GOAL #1   Title Pt will be independent with HEP in order to improve strength and balance in order to decrease fall risk and improve function at home and work.    Baseline 03/02/2021- Patient with no formal HEP in place; 05/08/2021- Patient able to verbalize good understanding of current HEP involving strengthening and mobility including ambulation with cane.    Time 6    Period Weeks    Status Achieved    Target Date 04/13/21      PT SHORT TERM GOAL #2   Title Patient will demonstrate safe and modified independent sit to stand transfers for improved independence in the home and decreased caregiver assist.    Baseline 03/02/2021- Paitent requires min assist for safe transfers in clinic today; 05/08/2021= Patient able to demonstrate modified ind with all sit to stand transfers using BUE and a quad cane without difficulty.    Time 6    Period Weeks    Status Achieved    Target Date 04/13/21               PT Long Term Goals - 03/02/21 1146       PT LONG TERM GOAL #1   Title Pt will improve FOTO to target score of 73 to display perceived improvements in ability to complete ADL's.    Baseline 03/02/2021= 58%    Time 12    Period Weeks    Status New    Target Date 05/25/21      PT LONG TERM GOAL #2   Title Pt will improve BERG by at least 13 points in order to demonstrate clinically significant improvement in balance.    Baseline 03/02/2021= 8/56    Time 12    Period Weeks    Status New    Target Date 05/25/21      PT LONG TERM GOAL #3   Title Pt will decrease 5TSTS by at least 10 seconds in order to demonstrate clinically  significant improvement in LE strength.    Baseline 03/02/2021=36.11 sec with BUE Support    Time 12    Period Weeks    Status New    Target Date 05/25/21      PT LONG TERM GOAL #4   Title Pt will decrease TUG to below 18 seconds/decrease in order to  demonstrate decreased fall risk.    Baseline 03/02/2021= 45.14 sec using RW    Time 12    Period Weeks    Status New    Target Date 05/25/21      PT LONG TERM GOAL #5   Title Patient will be modified independent in walking on even/uneven surface with least restrictive assistive device, for 20+ minutes without rest break, reporting some difficulty or less to improve walking tolerance with community ambulation including grocery shopping, going to church,etc.    Baseline 03/02/2021= Patient limited to < 100 feet using RW    Time 12    Period Weeks    Status New    Target Date 05/25/21                   Plan - 05/22/21 1113     Clinical Impression Statement Patient with some unsteadiness with each type of cane although he did improve with VC and practice. He was motivated to attempt all gait and balance activities and responsive to VC for improving his step length for more symmetrical steps. He was able to improve with step length overall after much practice. Patient will continue to benefit from skilled physical therapy services in order to improve his ambulatory capacity, balance, overall function, and improve his overall quality of life    Personal Factors and Comorbidities Comorbidity 1    Comorbidities diabetes    Examination-Activity Limitations Bathing;Bed Mobility;Bend;Caring for Others;Carry;Dressing;Lift;Locomotion Level;Squat;Stairs;Stand;Transfers    Examination-Participation Restrictions Cleaning;Community Activity;Driving;Occupation;Yard Work    Stability/Clinical Decision Making Stable/Uncomplicated    Rehab Potential Good    PT Frequency 2x / week    PT Duration 12 weeks    PT Treatment/Interventions ADLs/Self Care Home  Management;Cryotherapy;Moist Heat;DME Instruction;Gait training;Stair training;Functional mobility training;Therapeutic activities;Therapeutic exercise;Balance training;Neuromuscular re-education;Patient/family education;Manual techniques;Passive range of motion;Dry needling    PT Next Visit Plan Continue with Balance and progress LE strengthening activities as appropriate.    PT Home Exercise Plan no changes    Consulted and Agree with Plan of Care Patient;Family member/caregiver;Other (Comment)    Family Member Consulted Wife             Patient will benefit from skilled therapeutic intervention in order to improve the following deficits and impairments:  Abnormal gait, Decreased activity tolerance, Decreased balance, Decreased coordination, Decreased endurance, Decreased mobility, Difficulty walking, Decreased strength  Visit Diagnosis: Abnormality of gait and mobility  Difficulty in walking, not elsewhere classified  Muscle weakness (generalized)  Unsteadiness on feet     Problem List There are no problems to display for this patient.   Lenda Kelp, PT 05/22/2021, 11:34 AM   [facility] MAIN [facility] SERVICES [address] [place], [place], [address] Phone: [phone]   Fax:  [phone]  Name: Dennis Mcintosh MRN: [id number] Date of Birth: 07/09/1966

## 2021-05-23 ENCOUNTER — Encounter: Payer: Medicaid Other | Admitting: Speech Pathology

## 2021-05-25 ENCOUNTER — Encounter: Payer: Medicaid Other | Admitting: Speech Pathology

## 2021-05-29 ENCOUNTER — Other Ambulatory Visit: Payer: Self-pay

## 2021-05-29 ENCOUNTER — Ambulatory Visit: Payer: Medicaid Other | Attending: Physical Medicine and Rehabilitation

## 2021-05-29 DIAGNOSIS — R278 Other lack of coordination: Secondary | ICD-10-CM | POA: Insufficient documentation

## 2021-05-29 DIAGNOSIS — R262 Difficulty in walking, not elsewhere classified: Secondary | ICD-10-CM | POA: Diagnosis present

## 2021-05-29 DIAGNOSIS — R269 Unspecified abnormalities of gait and mobility: Secondary | ICD-10-CM | POA: Insufficient documentation

## 2021-05-29 DIAGNOSIS — R2681 Unsteadiness on feet: Secondary | ICD-10-CM | POA: Diagnosis present

## 2021-05-29 DIAGNOSIS — M6281 Muscle weakness (generalized): Secondary | ICD-10-CM | POA: Diagnosis present

## 2021-05-30 ENCOUNTER — Encounter: Payer: Medicaid Other | Admitting: Speech Pathology

## 2021-05-30 NOTE — Therapy (Signed)
[place] MAIN [facility] SERVICES [address] [place], [place], [address] Phone: [phone]   Fax:  [phone]  Physical Therapy Treatment/Recertification for dates 05/29/2021- 08/21/2021  Patient Details  Name: Dennis Mcintosh MRN: [id number] Date of Birth: 09-Feb-1966 Referring Provider (PT): Dr. Ophelia Shoulder   Encounter Date: 05/29/2021   PT End of Session - 05/29/21 0751     Visit Number 13    Number of Visits 25    Date for PT Re-Evaluation 08/21/21    Authorization Time Period Initial Certification 5/0/5397-67/34/1937;    Progress Note Due on Visit 20    PT Start Time 1015    PT Stop Time 1059    PT Time Calculation (min) 44 min    Equipment Utilized During Treatment Gait belt    Activity Tolerance Patient tolerated treatment well    Behavior During Therapy WFL for tasks assessed/performed             Past Medical History:  Diagnosis Date   Diabetes mellitus without complication ([place])     Past Surgical History:  Procedure Laterality Date   HEMORRHOID SURGERY      There were no vitals filed for this visit.   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 S/p removal of trach/G tube on 5/27. He most recently presented to ED on 02/26/2021 due to nausea and vomiting. He was at [facility] re[facility] from 01/31/2021- 02/23/2021.    Limitations Lifting;Standing;Walking;House hold activities    How long can you sit comfortably? no issues    How long can you stand comfortably? < 5 min    How long can you walk comfortably? < 5 min    Patient Stated Goals I want to go back to work    Currently in Pain? No/denies                   INTERVENTIONS:   Reassessed all goals  for recertification today:   FOTO= 52% (utilized interpreter and spanish version of FOTO) - Decreased from 58%  BERG= 37/56 (improved from 8/56)- GOAL MET yet patient still at significant risk of falling. Will update BERG GOAL to reflect progress.   5x STS= 17.87 sec (without UE support- improved from 36.11 with use of BUE support) - GOAL MET  TUG= 24.68 sec with SBQC (improved from 45.14 sec using RW)-improving toward goal     10MWT= 0.41 m/s using SBQC Walking 20+ min- Patient states has not walked more than 5 min- Will test gait more next visit including new goal for 6 min walk test.   Clinical Impression: Patient performed many functional outcome tests today. He presents with functional progress including meeting his functional LE strength goal and progressing with his timed up and go score as well as his balance goal (improved BERG). He continues to present with balance deficits including unsteadiness with turning, difficulty with transfers. His scores indicate progress but his self assessment did not improve. He and his wife however both verbalize that they feel he is doing better. Patient's condition has the potential to improve in response to therapy. Maximum improvement is yet to be obtained. The anticipated improvement is attainable and reasonable in a generally predictable time.  PT Education - 05/29/21 1027     Education Details Purpose of Functional outcome testing.    Person(s) Educated Patient    Methods Explanation;Demonstration;Tactile cues;Verbal cues    Comprehension Verbalized understanding;Returned demonstration;Verbal cues required;Need further instruction              PT Short Term Goals - 05/08/21 1017       PT SHORT TERM GOAL #1   Title Pt will be independent with HEP in order to improve strength and balance in order to decrease fall risk and improve function at home and work.    Baseline 03/02/2021- Patient with no formal HEP  in place; 05/08/2021- Patient able to verbalize good understanding of current HEP involving strengthening and mobility including ambulation with cane.    Time 6    Period Weeks    Status Achieved    Target Date 04/13/21      PT SHORT TERM GOAL #2   Title Patient will demonstrate safe and modified independent sit to stand transfers for improved independence in the home and decreased caregiver assist.    Baseline 03/02/2021- Paitent requires min assist for safe transfers in clinic today; 05/08/2021= Patient able to demonstrate modified ind with all sit to stand transfers using BUE and a quad cane without difficulty.    Time 6    Period Weeks    Status Achieved    Target Date 04/13/21               PT Long Term Goals - 05/29/21 1026       PT LONG TERM GOAL #1   Title Pt will improve FOTO to target score of 73 to display perceived improvements in ability to complete ADL's.    Baseline 03/02/2021= 58%; 05/29/2021= 52%    Time 12    Period Weeks    Status New    Target Date 08/21/21      PT LONG TERM GOAL #2   Title Pt will improve BERG by at least 13 points in order to demonstrate clinically significant improvement in balance.    Baseline 03/02/2021= 8/56; 05/29/2021= 37/56    Time 12    Period Weeks    Status Achieved      PT LONG TERM GOAL #3   Title Pt will decrease 5TSTS by at least 10 seconds in order to demonstrate clinically significant improvement in LE strength.    Baseline 03/02/2021=36.11 sec with BUE Support; 05/29/2021= 17.87 sec without UE support    Time 12    Period Weeks    Status Achieved      PT LONG TERM GOAL #4   Title Pt will decrease TUG to below 18 seconds/decrease in order to demonstrate decreased fall risk.    Baseline 03/02/2021= 45.14 sec using RW; 05/29/2021= 24.68 sec with SBQC    Time 12    Period Weeks    Status On-going    Target Date 08/21/21      PT LONG TERM GOAL #5   Title Patient will be modified independent in walking on even/uneven surface  with least restrictive assistive device, for 20+ minutes without rest break, reporting some difficulty or less to improve walking tolerance with community ambulation including grocery shopping, going to church,etc.    Baseline 03/02/2021= Patient limited to < 100 feet using RW: Patient currently walking around 300 feet with use of SBQC in clinic during sessions- not directly assessed today- will reassess next session.    Time 12  Period Weeks    Status On-going    Target Date 08/21/21      Additional Long Term Goals   Additional Long Term Goals Yes      PT LONG TERM GOAL #6   Title Pt will improve BERG by at least 7 points in order to demonstrate clinically significant improvement in balance.    Baseline 05/29/2021= 37/56    Time 12    Period Weeks    Status New    Target Date 08/21/21      PT LONG TERM GOAL #7   Title Pt will increase 10MWT by at least 0.13 m/s in order to demonstrate clinically significant improvement in community ambulation.    Baseline 05/29/2021= 0.41 m/s    Time 12    Period Weeks    Status New    Target Date 08/21/21                   Plan - 05/29/21 0753     Clinical Impression Statement Patient performed many functional outcome tests today. He presents with functional progress including meeting his functional LE strength goal and progressing with his timed up and go score as well as his balance goal (improved BERG). He continues to present with balance deficits including unsteadiness with turning, difficulty with transfers. His scores indicate progress but his self assessment did not improve. He and his wife however both verbalize that they feel he is doing better. Patient's condition has the potential to improve in response to therapy. Maximum improvement is yet to be obtained. The anticipated improvement is attainable and reasonable in a generally predictable time.    Personal Factors and Comorbidities Comorbidity 1    Comorbidities diabetes     Examination-Activity Limitations Bathing;Bed Mobility;Bend;Caring for Others;Carry;Dressing;Lift;Locomotion Level;Squat;Stairs;Stand;Transfers    Examination-Participation Restrictions Cleaning;Community Activity;Driving;Occupation;Yard Work    Stability/Clinical Decision Making Stable/Uncomplicated    Rehab Potential Good    PT Frequency 2x / week    PT Duration 12 weeks    PT Treatment/Interventions ADLs/Self Care Home Management;Cryotherapy;Moist Heat;DME Instruction;Gait training;Stair training;Functional mobility training;Therapeutic activities;Therapeutic exercise;Balance training;Neuromuscular re-education;Patient/family education;Manual techniques;Passive range of motion;Dry needling    PT Next Visit Plan Continue with Balance and progress LE strengthening activities as appropriate.    PT Home Exercise Plan no changes    Consulted and Agree with Plan of Care Patient;Family member/caregiver;Other (Comment)    Family Member Consulted Wife             Patient will benefit from skilled therapeutic intervention in order to improve the following deficits and impairments:  Abnormal gait, Decreased activity tolerance, Decreased balance, Decreased coordination, Decreased endurance, Decreased mobility, Difficulty walking, Decreased strength  Visit Diagnosis: Abnormality of gait and mobility  Difficulty in walking, not elsewhere classified  Muscle weakness (generalized)  Unsteadiness on feet     Problem List There are no problems to display for this patient.   Lewis Moccasin, PT 05/30/2021, 8:18 AM  [place] MAIN [facility] SERVICES [address] [place], [place], [address] Phone: [phone]   Fax:  [phone]  Name: Dennis Mcintosh MRN: [id number] Date of Birth: Apr 23, 1966

## 2021-06-01 ENCOUNTER — Encounter: Payer: Medicaid Other | Admitting: Speech Pathology

## 2021-06-05 ENCOUNTER — Other Ambulatory Visit: Payer: Self-pay

## 2021-06-05 ENCOUNTER — Ambulatory Visit: Payer: Medicaid Other

## 2021-06-05 DIAGNOSIS — R269 Unspecified abnormalities of gait and mobility: Secondary | ICD-10-CM | POA: Diagnosis not present

## 2021-06-05 DIAGNOSIS — R262 Difficulty in walking, not elsewhere classified: Secondary | ICD-10-CM

## 2021-06-05 DIAGNOSIS — R278 Other lack of coordination: Secondary | ICD-10-CM

## 2021-06-05 DIAGNOSIS — R2681 Unsteadiness on feet: Secondary | ICD-10-CM

## 2021-06-05 NOTE — Therapy (Signed)
[facility] MAIN [facility] SERVICES [address] [place], [place], [address] Phone: [phone]   Fax:  [phone]  Physical Therapy Treatment  Patient Details  Name: Dennis Mcintosh MRN: [id number] Date of Birth: January 25, 1966 Referring Provider (PT): Dr. Windle Guard   Encounter Date: 06/05/2021   PT End of Session - 06/05/21 0921     Visit Number 14    Number of Visits 25    Date for PT Re-Evaluation 08/21/21    Authorization Time Period Initial Certification 03/02/2021-05/25/2021; Recert -08/21/2021    Progress Note Due on Visit 20    PT Start Time 0805    PT Stop Time 0846    PT Time Calculation (min) 41 min    Equipment Utilized During Treatment Gait belt    Activity Tolerance Patient tolerated treatment well    Behavior During Therapy WFL for tasks assessed/performed             Past Medical History:  Diagnosis Date   Diabetes mellitus without complication ([facility])     Past Surgical History:  Procedure Laterality Date   HEMORRHOID SURGERY      There were no vitals filed for this visit.   Subjective Assessment - 06/05/21 0819     Subjective Patient reports he is walkng at home with his quad cane and that he has not fallen.    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 S/p removal of trach/G tube on 5/27. He most recently presented to ED on 02/26/2021 due to nausea and vomiting. He was at [facility] rehab from 01/31/2021- 02/23/2021.    Limitations Lifting;Standing;Walking;House hold activities    How long can you sit comfortably? no issues    How long can you stand comfortably? < 5 min    How long can you walk comfortably? < 5 min    Patient Stated Goals I want to go back to work    Currently in Pain? No/denies               INTERVENTIONS:   6 min walk test= 165 feet  in 1 min 43 sec- using SBQC - CGA using gait belt with 2-3 episodes of LOB.  2nd trial with use of Hurrycane- 200 feet in 1 min 58 sec- Still occasional unsteadiness   Neuromuscular re-ed   Static stand on blue airex pad- feet apart- performing various conditions while standing on pad including eyes open x 20 sec without LOB and only very mild unsteadiness, eyes closed x 3 trials - varying level of unsteadiness- mostly A/P sway with intermittent reaching for furniture, eyes open with head turning - mild unsteadiness x 3 trials- eyes closed with head turning x 3 trials- multiple reaching for UE support due to unsteadiness.   Reviewed and patient performed standing in corner performing multiple attempts of tandem (or near tandem standing) able to hold for 5-18 sec at best - increased unsteadiness - mostly able to hold approx 5-8 sec.  Next, Single leg stance- attempting to hold up to 10 sec- patient with increased unsteadiness and unable to hold > 5 sec today requiring frequent reaching for UE support. Encouraged practicing these two exercises at home and prescribed addition to HEP as below.    Access Code: [email or web address] URL: [email or web address] Date: 06/05/2021 Prepared by: Maureen Ralphs, PT  Exercises Tandem Stance in Corner - 1 x daily -  3 x weekly - 3 sets - 20 hold Single Leg Stance - 1 x daily - 3 x weekly - 3 sets - 5-20 hold    Also patient performed 10 reps of sit to stand without UE support- Reports as "easy" today and demonstrated no LOB.   Clinical Impression: Patient challenged today with 6 min walk- both from an endurance perspective as well as balance. He experienced instances of unsteadiness with his SBQC and the hurrycane- requiring min A to recover. Patient denied any dizziness stating just "Off-balanced." Will add new goal to focus on functional endurance (6 min walk test goal). Patient also reviewed some previously instructed balance activities to ensure he  understood his balance portion of his HEP. He was issued another handout including tandem and SLS and verbalized understanding. Patient will continue to benefit from skilled physical therapy services in order to improve his ambulatory capacity, balance, overall function, and improve his overall quality of life                     PT Education - 06/05/21 0821     Education Details Purpose of 6 min walk test    Person(s) Educated Patient    Methods Explanation;Demonstration;Tactile cues;Verbal cues    Comprehension Verbalized understanding;Returned demonstration;Verbal cues required;Need further instruction;Tactile cues required              PT Short Term Goals - 05/08/21 1017       PT SHORT TERM GOAL #1   Title Pt will be independent with HEP in order to improve strength and balance in order to decrease fall risk and improve function at home and work.    Baseline 03/02/2021- Patient with no formal HEP in place; 05/08/2021- Patient able to verbalize good understanding of current HEP involving strengthening and mobility including ambulation with cane.    Time 6    Period Weeks    Status Achieved    Target Date 04/13/21      PT SHORT TERM GOAL #2   Title Patient will demonstrate safe and modified independent sit to stand transfers for improved independence in the home and decreased caregiver assist.    Baseline 03/02/2021- Paitent requires min assist for safe transfers in clinic today; 05/08/2021= Patient able to demonstrate modified ind with all sit to stand transfers using BUE and a quad cane without difficulty.    Time 6    Period Weeks    Status Achieved    Target Date 04/13/21               PT Long Term Goals - 05/29/21 1026       PT LONG TERM GOAL #1   Title Pt will improve FOTO to target score of 73 to display perceived improvements in ability to complete ADL's.    Baseline 03/02/2021= 58%; 05/29/2021= 52%    Time 12    Period Weeks    Status New     Target Date 08/21/21      PT LONG TERM GOAL #2   Title Pt will improve BERG by at least 13 points in order to demonstrate clinically significant improvement in balance.    Baseline 03/02/2021= 8/56; 05/29/2021= 37/56    Time 12    Period Weeks    Status Achieved      PT LONG TERM GOAL #3   Title Pt will decrease 5TSTS by at least 10 seconds in order to demonstrate clinically significant improvement in LE strength.  Baseline 03/02/2021=36.11 sec with BUE Support; 05/29/2021= 17.87 sec without UE support    Time 12    Period Weeks    Status Achieved      PT LONG TERM GOAL #4   Title Pt will decrease TUG to below 18 seconds/decrease in order to demonstrate decreased fall risk.    Baseline 03/02/2021= 45.14 sec using RW; 05/29/2021= 24.68 sec with SBQC    Time 12    Period Weeks    Status On-going    Target Date 08/21/21      PT LONG TERM GOAL #5   Title Patient will be modified independent in walking on even/uneven surface with least restrictive assistive device, for 20+ minutes without rest break, reporting some difficulty or less to improve walking tolerance with community ambulation including grocery shopping, going to church,etc.    Baseline 03/02/2021= Patient limited to < 100 feet using RW: Patient currently walking around 300 feet with use of SBQC in clinic during sessions- not directly assessed today- will reassess next session.    Time 12    Period Weeks    Status On-going    Target Date 08/21/21      Additional Long Term Goals   Additional Long Term Goals Yes      PT LONG TERM GOAL #6   Title Pt will improve BERG by at least 7 points in order to demonstrate clinically significant improvement in balance.    Baseline 05/29/2021= 37/56    Time 12    Period Weeks    Status New    Target Date 08/21/21      PT LONG TERM GOAL #7   Title Pt will increase by at least 0.13 m/s in order to demonstrate clinically significant improvement in community ambulation.    Baseline  05/29/2021= 0.41 m/s    Time 12    Period Weeks    Status New    Target Date 08/21/21                   Plan - 06/05/21 6387     Clinical Impression Statement Patient challenged today with 6 min walk- both from an endurance perspective as well as balance. He experienced instances of unsteadiness with his SBQC and the hurrycane- requiring min A to recover. Patient denied any dizziness stating just "Off-balanced." Will add new goal to focus on functional endurance (6 min walk test goal). Patient also reviewed some previously instructed balance activities to ensure he understood his balance portion of his HEP. He was issued another handout including tandem and SLS and verbalized understanding. Patient will continue to benefit from skilled physical therapy services in order to improve his ambulatory capacity, balance, overall function, and improve his overall quality of life    Personal Factors and Comorbidities Comorbidity 1    Comorbidities diabetes    Examination-Activity Limitations Bathing;Bed Mobility;Bend;Caring for Others;Carry;Dressing;Lift;Locomotion Level;Squat;Stairs;Stand;Transfers    Examination-Participation Restrictions Cleaning;Community Activity;Driving;Occupation;Yard Work    Stability/Clinical Decision Making Stable/Uncomplicated    Rehab Potential Good    PT Frequency 2x / week    PT Duration 12 weeks    PT Treatment/Interventions ADLs/Self Care Home Management;Cryotherapy;Moist Heat;DME Instruction;Gait training;Stair training;Functional mobility training;Therapeutic activities;Therapeutic exercise;Balance training;Neuromuscular re-education;Patient/family education;Manual techniques;Passive range of motion;Dry needling    PT Next Visit Plan Continue with Balance and progress LE strengthening activities as appropriate.    PT Home Exercise Plan no changes    Consulted and Agree with Plan of Care Patient;Family member/caregiver;Other (Comment)    Family  Member Consulted  Wife             Patient will benefit from skilled therapeutic intervention in order to improve the following deficits and impairments:  Abnormal gait, Decreased activity tolerance, Decreased balance, Decreased coordination, Decreased endurance, Decreased mobility, Difficulty walking, Decreased strength  Visit Diagnosis: Abnormality of gait and mobility  Difficulty in walking, not elsewhere classified  Other lack of coordination  Unsteadiness on feet     Problem List There are no problems to display for this patient.   Lenda Kelp, PT 06/05/2021, 9:26 AM  [place] [facility] MAIN [facility] SERVICES [address] [place], [place], [address] Phone: [phone]   Fax:  [phone]  Name: Dennis Mcintosh MRN: [id number] Date of Birth: 27-Aug-1965

## 2021-06-08 ENCOUNTER — Encounter: Payer: Medicaid Other | Admitting: Speech Pathology

## 2021-06-12 ENCOUNTER — Other Ambulatory Visit: Payer: Self-pay

## 2021-06-12 ENCOUNTER — Ambulatory Visit: Payer: Medicaid Other

## 2021-06-12 DIAGNOSIS — M6281 Muscle weakness (generalized): Secondary | ICD-10-CM

## 2021-06-12 DIAGNOSIS — R2681 Unsteadiness on feet: Secondary | ICD-10-CM

## 2021-06-12 DIAGNOSIS — R262 Difficulty in walking, not elsewhere classified: Secondary | ICD-10-CM

## 2021-06-12 DIAGNOSIS — R269 Unspecified abnormalities of gait and mobility: Secondary | ICD-10-CM | POA: Diagnosis not present

## 2021-06-12 NOTE — Therapy (Addendum)
[facility] MAIN [facility] SERVICES [address] [place], [place], [address] Phone: [phone]   Fax:  [phone]  Physical Therapy Treatment  Patient Details  Name: Dennis Mcintosh MRN: [id number] Date of Birth: Sep 28, 1965 Referring Provider (PT): Dr. Windle Guard   Encounter Date: 06/12/2021   PT End of Session - 06/12/21 1106     Visit Number 15    Number of Visits 25    Date for PT Re-Evaluation 08/21/21    Authorization Time Period Initial Certification 03/02/2021-05/25/2021; Recert -08/21/2021    Progress Note Due on Visit 20    PT Start Time 1018    PT Stop Time 1100    PT Time Calculation (min) 42 min    Equipment Utilized During Treatment Gait belt    Activity Tolerance Patient tolerated treatment well    Behavior During Therapy WFL for tasks assessed/performed             Past Medical History:  Diagnosis Date   Diabetes mellitus without complication (HCC)     Past Surgical History:  Procedure Laterality Date   HEMORRHOID SURGERY      There were no vitals filed for this visit.   Subjective Assessment - 06/12/21 1104     Subjective Patient reports he has been walking in his home with his quad cane and has had no new falls. 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 S/p removal of trach/G tube on 5/27. He most recently presented to ED on 02/26/2021 due to nausea and vomiting. He was at [facility] rehab from 01/31/2021- 02/23/2021.    Limitations Lifting;Standing;Walking;House hold activities    How long can you sit comfortably? no issues    How long can you stand comfortably? < 5 min    How long can you walk comfortably? < 5 min    Patient Stated Goals I want to go back to work               INTERVENTION:  Therex:  STS x 12 without UE support. Pt perceives as easy  - progressed to staggered stance with left foot forward x 12 reps  - progressed to staggered stance with left foot forward and on airex x 12 reps  Heel raises 2 x 12 on half foam, flat side down in // bars. Pt perceives as medium difficulty  Neuromuscular Re-ed:  All interventions with CGA unless otherwise specified  Ambulation with SBQC down the hallway with right and left head turns 4 x 50 ft. Pt presents with more difficulty with head turns to the right exhibiting lateral sway to the left. 2 instances of LOB requiring MinA from SPT.  Ambulation with SBQC 2 x 10 feet to cone with 180 degree turn each side. Pt has more trouble turning to the right but improves with trials. Pt does not exhibit LOB.  On firm surface in // bars: Tandem stance 2 x 30 sec each side. Pt requiring intermittent UE support to regain balance (x3). Pt improves with each trial. SLS 2 x 30 sec each side. Pt able to hold for 4 sec on the right and 6 sec on the left unsupported.  On Airex in // bars: NBOS eyes open x 30 sec. Pt reports as easy. Semi-tandem stance 2 x 45 sec each side. Pt requiring  intermittent UE support to regain balance (x4) Tandem stance 2 x 30 sec each side. Pt requiring intermittent UE support to regain balance (multiple). Pt improves with trials   Ambulation loop with SBQC around PT gym with visual scanning to find hidden cones x7 around the gym.   The pt would likely benefit from progressing to a SPC to improve safety of ambulation and increase independence.    PT Education - 06/12/21 1105     Education Details exercise technique, body mechanics    Person(s) Educated Patient    Methods Explanation;Demonstration;Verbal cues;Tactile cues    Comprehension Verbalized understanding;Returned demonstration;Verbal cues required;Tactile cues required;Need further instruction              PT Short Term  Goals - 05/08/21 1017       PT SHORT TERM GOAL #1   Title Pt will be independent with HEP in order to improve strength and balance in order to decrease fall risk and improve function at home and work.    Baseline 03/02/2021- Patient with no formal HEP in place; 05/08/2021- Patient able to verbalize good understanding of current HEP involving strengthening and mobility including ambulation with cane.    Time 6    Period Weeks    Status Achieved    Target Date 04/13/21      PT SHORT TERM GOAL #2   Title Patient will demonstrate safe and modified independent sit to stand transfers for improved independence in the home and decreased caregiver assist.    Baseline 03/02/2021- Paitent requires min assist for safe transfers in clinic today; 05/08/2021= Patient able to demonstrate modified ind with all sit to stand transfers using BUE and a quad cane without difficulty.    Time 6    Period Weeks    Status Achieved    Target Date 04/13/21               PT Long Term Goals - 05/29/21 1026       PT LONG TERM GOAL #1   Title Pt will improve FOTO to target score of 73 to display perceived improvements in ability to complete ADL's.    Baseline 03/02/2021= 58%; 05/29/2021= 52%    Time 12    Period Weeks    Status New    Target Date 08/21/21      PT LONG TERM GOAL #2   Title Pt will improve BERG by at least 13 points in order to demonstrate clinically significant improvement in balance.    Baseline 03/02/2021= 8/56; 05/29/2021= 37/56    Time 12    Period Weeks    Status Achieved      PT LONG TERM GOAL #3   Title Pt will decrease 5TSTS by at least 10 seconds in order to demonstrate clinically significant improvement in LE strength.    Baseline 03/02/2021=36.11 sec with BUE Support; 05/29/2021= 17.87 sec without UE support    Time 12    Period Weeks    Status Achieved      PT LONG TERM GOAL #4   Title Pt will decrease TUG to below 18 seconds/decrease in order to demonstrate decreased fall risk.     Baseline 03/02/2021= 45.14 sec using RW; 05/29/2021= 24.68 sec with SBQC    Time 12    Period Weeks    Status On-going    Target Date 08/21/21      PT LONG TERM GOAL #5   Title Patient will be modified independent in walking on even/uneven surface  with least restrictive assistive device, for 20+ minutes without rest break, reporting some difficulty or less to improve walking tolerance with community ambulation including grocery shopping, going to church,etc.    Baseline 03/02/2021= Patient limited to < 100 feet using RW: Patient currently walking around 300 feet with use of SBQC in clinic during sessions- not directly assessed today- will reassess next session.    Time 12    Period Weeks    Status On-going    Target Date 08/21/21      Additional Long Term Goals   Additional Long Term Goals Yes      PT LONG TERM GOAL #6   Title Pt will improve BERG by at least 7 points in order to demonstrate clinically significant improvement in balance.    Baseline 05/29/2021= 37/56    Time 12    Period Weeks    Status New    Target Date 08/21/21      PT LONG TERM GOAL #7   Title Pt will increase by at least 0.13 m/s in order to demonstrate clinically significant improvement in community ambulation.    Baseline 05/29/2021= 0.41 m/s    Time 12    Period Weeks    Status New    Target Date 08/21/21                   Plan - 06/12/21 1128     Clinical Impression Statement Pt was highly motivated for all interventions today. He tolerated all balance progressions well and presented with improvements in tandem stance. He was challenged with ambulation with added head turns to the right, but showed in session improvement with fewer losses of balance. He excelled with visual scanning and finding the cones with no losses of balance. Pt is progressing with ambulation a single point cane will likely improve the safety of his ambulation. The pt will continue to benefit from skilled physical therapy  services in order to improve his ambulatory capacity, balance, overall function, and improve his overall quality of life.    Personal Factors and Comorbidities Comorbidity 1    Comorbidities diabetes    Examination-Activity Limitations Bathing;Bed Mobility;Bend;Caring for Others;Carry;Dressing;Lift;Locomotion Level;Squat;Stairs;Stand;Transfers    Examination-Participation Restrictions Cleaning;Community Activity;Driving;Occupation;Yard Work    Stability/Clinical Decision Making Stable/Uncomplicated    Rehab Potential Good    PT Frequency 2x / week    PT Duration 12 weeks    PT Treatment/Interventions ADLs/Self Care Home Management;Cryotherapy;Moist Heat;DME Instruction;Gait training;Stair training;Functional mobility training;Therapeutic activities;Therapeutic exercise;Balance training;Neuromuscular re-education;Patient/family education;Manual techniques;Passive range of motion;Dry needling    PT Next Visit Plan Continue with Balance and progress LE strengthening activities as appropriate.    PT Home Exercise Plan no changes    Consulted and Agree with Plan of Care Patient;Family member/caregiver;Other (Comment)    Family Member Consulted Wife             Patient will benefit from skilled therapeutic intervention in order to improve the following deficits and impairments:  Abnormal gait, Decreased activity tolerance, Decreased balance, Decreased coordination, Decreased endurance, Decreased mobility, Difficulty walking, Decreased strength  Visit Diagnosis: Abnormality of gait and mobility  Difficulty in walking, not elsewhere classified  Unsteadiness on feet  Muscle weakness (generalized)     Problem List There are no problems to display for this patient.  Ramond Craver, SPT This entire session was performed under direct supervision and direction of a licensed therapist/therapist assistant . I have personally read, edited and approve of the note as written.  Louis Meckel, PT 06/12/2021, 11:40 AM  [place] [facility] MAIN [facility] SERVICES [address] [place], [place], [address] Phone: [phone]   Fax:  [phone]  Name: Dennis Mcintosh MRN: [id number] Date of Birth: 14-Aug-1965

## 2021-06-15 ENCOUNTER — Encounter: Payer: Medicaid Other | Admitting: Speech Pathology

## 2021-06-19 ENCOUNTER — Ambulatory Visit: Payer: Medicaid Other

## 2021-06-19 ENCOUNTER — Other Ambulatory Visit: Payer: Self-pay

## 2021-06-19 DIAGNOSIS — R2681 Unsteadiness on feet: Secondary | ICD-10-CM

## 2021-06-19 DIAGNOSIS — R269 Unspecified abnormalities of gait and mobility: Secondary | ICD-10-CM | POA: Diagnosis not present

## 2021-06-19 DIAGNOSIS — M6281 Muscle weakness (generalized): Secondary | ICD-10-CM

## 2021-06-19 DIAGNOSIS — R262 Difficulty in walking, not elsewhere classified: Secondary | ICD-10-CM

## 2021-06-19 NOTE — Therapy (Signed)
[place] [facility] MAIN [facility] SERVICES [address] [place], [place], [address] Phone: [phone]   Fax:  [phone]  Physical Therapy Treatment  Patient Details  Name: Dennis Mcintosh MRN: [id number] Date of Birth: Oct 31, 1965 Referring Provider (PT): Dr. Windle Guard   Encounter Date: 06/19/2021   PT End of Session - 06/19/21 1549     Visit Number 16    Number of Visits 25    Date for PT Re-Evaluation 08/21/21    Authorization Time Period Initial Certification 03/02/2021-05/25/2021; Recert -08/21/2021    Progress Note Due on Visit 20    PT Start Time 1022    PT Stop Time 1059    PT Time Calculation (min) 37 min    Equipment Utilized During Treatment Gait belt    Activity Tolerance Patient tolerated treatment well    Behavior During Therapy WFL for tasks assessed/performed             Past Medical History:  Diagnosis Date   Diabetes mellitus without complication (HCC)     Past Surgical History:  Procedure Laterality Date   HEMORRHOID SURGERY      There were no vitals filed for this visit.   Subjective Assessment - 06/19/21 1510     Subjective Patient reports he is sore today and reports he has been working out more at home and leg quad is sore.    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 S/p removal of trach/G tube on 5/27. He most recently presented to ED on 02/26/2021 due to nausea and vomiting. He was at [facility] rehab from 01/31/2021- 02/23/2021.    Limitations Lifting;Standing;Walking;House hold activities    How long can you sit comfortably? no issues    How long can you stand comfortably? < 5 min    How long can you walk comfortably? < 5 min    Patient Stated Goals I want to go back to work    Currently in Pain? No/denies            Interpreter:  Maritza  INTERVENTIONS:    Assessed gait with use of hurrycane and patient ambulated approx 175 feet with 1 seated rest break- Initially with some unsteadiness and scissoring of gait - CGA using gait belt. He did improve on second portion of walk after providing verbal cues specifically to keep each leg on the corresponding side.   Neuromuscular re-ed:   Static stand with 4 colored 1/2 balls (ant/post/med/lat) and upon interpreter voice calling out color patient would step tap onto corresponding 1/2 ball initially with 1 UE support - progressed to no UE support (practice each leg). Patient demon increased unsteadiness while tapping with left LE today and multiple Loss of balance.   Single leg stance- 5-8 sec each leg x multiple attempts.   Patient required several rest breaks to complete.  Education provided throughout session via VC/TC and demonstration to facilitate movement at target joints and correct muscle activation for all testing and exercises performed.    Clinical Impression: Patient presents with good motivation and able to respond to cues and use of interpreter. He did demonstrate improved gait quality overall with use of hurrycane versus quad cane with less overall unsteadiness. He was challenged with dynamic stepping and continues to present with limited ability to perform single leg stance. The pt will continue to benefit from skilled physical therapy  services in order to improve his ambulatory capacity, balance, overall function, and improve his overall quality of life.                    PT Education - 06/19/21 1511     Education Details Gait safety with cane; Exercise technique    Person(s) Educated Patient    Methods Explanation;Demonstration;Tactile cues;Verbal cues    Comprehension Verbalized understanding;Returned demonstration;Verbal cues required;Need further instruction;Tactile cues required              PT Short Term Goals - 05/08/21 1017        PT SHORT TERM GOAL #1   Title Pt will be independent with HEP in order to improve strength and balance in order to decrease fall risk and improve function at home and work.    Baseline 03/02/2021- Patient with no formal HEP in place; 05/08/2021- Patient able to verbalize good understanding of current HEP involving strengthening and mobility including ambulation with cane.    Time 6    Period Weeks    Status Achieved    Target Date 04/13/21      PT SHORT TERM GOAL #2   Title Patient will demonstrate safe and modified independent sit to stand transfers for improved independence in the home and decreased caregiver assist.    Baseline 03/02/2021- Paitent requires min assist for safe transfers in clinic today; 05/08/2021= Patient able to demonstrate modified ind with all sit to stand transfers using BUE and a quad cane without difficulty.    Time 6    Period Weeks    Status Achieved    Target Date 04/13/21               PT Long Term Goals - 05/29/21 1026       PT LONG TERM GOAL #1   Title Pt will improve FOTO to target score of 73 to display perceived improvements in ability to complete ADL's.    Baseline 03/02/2021= 58%; 05/29/2021= 52%    Time 12    Period Weeks    Status New    Target Date 08/21/21      PT LONG TERM GOAL #2   Title Pt will improve BERG by at least 13 points in order to demonstrate clinically significant improvement in balance.    Baseline 03/02/2021= 8/56; 05/29/2021= 37/56    Time 12    Period Weeks    Status Achieved      PT LONG TERM GOAL #3   Title Pt will decrease 5TSTS by at least 10 seconds in order to demonstrate clinically significant improvement in LE strength.    Baseline 03/02/2021=36.11 sec with BUE Support; 05/29/2021= 17.87 sec without UE support    Time 12    Period Weeks    Status Achieved      PT LONG TERM GOAL #4   Title Pt will decrease TUG to below 18 seconds/decrease in order to demonstrate decreased fall risk.    Baseline 03/02/2021=  45.14 sec using RW; 05/29/2021= 24.68 sec with SBQC    Time 12    Period Weeks    Status On-going    Target Date 08/21/21      PT LONG TERM GOAL #5   Title Patient will be modified independent in walking on even/uneven surface with least restrictive assistive device, for 20+ minutes without rest break, reporting some difficulty or less to improve walking tolerance with community ambulation including grocery shopping, going to church,etc.  Baseline 03/02/2021= Patient limited to < 100 feet using RW: Patient currently walking around 300 feet with use of SBQC in clinic during sessions- not directly assessed today- will reassess next session.    Time 12    Period Weeks    Status On-going    Target Date 08/21/21      Additional Long Term Goals   Additional Long Term Goals Yes      PT LONG TERM GOAL #6   Title Pt will improve BERG by at least 7 points in order to demonstrate clinically significant improvement in balance.    Baseline 05/29/2021= 37/56    Time 12    Period Weeks    Status New    Target Date 08/21/21      PT LONG TERM GOAL #7   Title Pt will increase by at least 0.13 m/s in order to demonstrate clinically significant improvement in community ambulation.    Baseline 05/29/2021= 0.41 m/s    Time 12    Period Weeks    Status New    Target Date 08/21/21                   Plan - 06/19/21 1548     Clinical Impression Statement Patient presents with good motivation and able to respond to cues and use of interpreter. He did demonstrate improved gait quality overall with use of hurrycane versus quad cane with less overall unsteadiness. He was challenged with dynamic stepping and continues to present with limited ability to perform single leg stance. The pt will continue to benefit from skilled physical therapy services in order to improve his ambulatory capacity, balance, overall function, and improve his overall quality of life.    Personal Factors and Comorbidities  Comorbidity 1    Comorbidities diabetes    Examination-Activity Limitations Bathing;Bed Mobility;Bend;Caring for Others;Carry;Dressing;Lift;Locomotion Level;Squat;Stairs;Stand;Transfers    Examination-Participation Restrictions Cleaning;Community Activity;Driving;Occupation;Yard Work    Stability/Clinical Decision Making Stable/Uncomplicated    Rehab Potential Good    PT Frequency 2x / week    PT Duration 12 weeks    PT Treatment/Interventions ADLs/Self Care Home Management;Cryotherapy;Moist Heat;DME Instruction;Gait training;Stair training;Functional mobility training;Therapeutic activities;Therapeutic exercise;Balance training;Neuromuscular re-education;Patient/family education;Manual techniques;Passive range of motion;Dry needling    PT Next Visit Plan Continue with Balance and progress LE strengthening activities as appropriate.    PT Home Exercise Plan no changes    Consulted and Agree with Plan of Care Patient;Family member/caregiver;Other (Comment)    Family Member Consulted Wife             Patient will benefit from skilled therapeutic intervention in order to improve the following deficits and impairments:  Abnormal gait, Decreased activity tolerance, Decreased balance, Decreased coordination, Decreased endurance, Decreased mobility, Difficulty walking, Decreased strength  Visit Diagnosis: Abnormality of gait and mobility  Difficulty in walking, not elsewhere classified  Muscle weakness (generalized)  Unsteadiness on feet     Problem List There are no problems to display for this patient.   Lenda Kelp, PT 06/19/2021, 3:51 PM   [facility] MAIN [facility] SERVICES [address] [place], [place], [address] Phone: [phone]   Fax:  [phone]  Name: Cecile Guevara MRN: [id number] Date of Birth: 12/06/1965

## 2021-06-26 ENCOUNTER — Ambulatory Visit: Payer: Medicaid Other

## 2021-06-29 ENCOUNTER — Encounter: Payer: Medicaid Other | Admitting: Speech Pathology

## 2021-07-03 ENCOUNTER — Ambulatory Visit: Payer: Medicaid Other

## 2021-07-06 ENCOUNTER — Encounter: Payer: Medicaid Other | Admitting: Speech Pathology

## 2021-07-10 ENCOUNTER — Ambulatory Visit: Payer: Medicaid Other

## 2021-07-13 ENCOUNTER — Encounter: Payer: Medicaid Other | Admitting: Speech Pathology

## 2021-07-16 NOTE — Telephone Encounter (Signed)
Pt's Voice evaluation routed to Dr Vergie Living at [facility].   Dennis Mcintosh B. Dennis Mcintosh M.S., CCC-SLP, [facility] Speech-Language Pathologist Rehabilitation Services Office [phone]

## 2021-07-17 ENCOUNTER — Ambulatory Visit: Payer: Medicaid Other

## 2021-07-20 ENCOUNTER — Encounter: Payer: Medicaid Other | Admitting: Speech Pathology

## 2021-07-24 ENCOUNTER — Ambulatory Visit: Payer: Medicaid Other

## 2021-07-27 ENCOUNTER — Encounter: Payer: Medicaid Other | Admitting: Speech Pathology

## 2021-08-21 ENCOUNTER — Ambulatory Visit: Payer: Medicaid Other | Attending: Physical Medicine and Rehabilitation

## 2021-08-21 ENCOUNTER — Other Ambulatory Visit: Payer: Self-pay

## 2021-08-21 DIAGNOSIS — M6281 Muscle weakness (generalized): Secondary | ICD-10-CM | POA: Diagnosis present

## 2021-08-21 DIAGNOSIS — R269 Unspecified abnormalities of gait and mobility: Secondary | ICD-10-CM | POA: Diagnosis present

## 2021-08-21 DIAGNOSIS — R278 Other lack of coordination: Secondary | ICD-10-CM | POA: Diagnosis present

## 2021-08-21 DIAGNOSIS — R262 Difficulty in walking, not elsewhere classified: Secondary | ICD-10-CM | POA: Diagnosis present

## 2021-08-21 DIAGNOSIS — R2681 Unsteadiness on feet: Secondary | ICD-10-CM | POA: Diagnosis present

## 2021-08-21 NOTE — Therapy (Signed)
[place] [facility] [address] , [place], [address] Phone: [phone]   Fax:  [phone]  Physical Therapy Treatment/Recertification for dates 08/21/2021- 11/13/2021  Patient Details  Name: Dennis Mcintosh MRN: [id number] Date of Birth: 11-13-1965 Referring Provider (PT): Dr. Windle GuardKimberly Rauch   Encounter Date: 08/21/2021   PT End of Session - 08/21/21 1409     Visit Number 17    Number of Visits 29    Date for PT Re-Evaluation 11/13/21    Authorization Time Period Initial Certification 03/02/2021-05/25/2021; Recert -08/21/2021; Recert 08/21/2021-11/13/2021    Progress Note Due on Visit 20    PT Start Time 1305    PT Stop Time 1355    PT Time Calculation (min) 50 min    Equipment Utilized During Treatment Gait belt    Activity Tolerance Patient tolerated treatment well    Behavior During Therapy WFL for tasks assessed/performed             Past Medical History:  Diagnosis Date   Diabetes mellitus without complication (HCC)     Past Surgical History:  Procedure Laterality Date   HEMORRHOID SURGERY      There were no vitals filed for this visit.   Subjective Assessment - 08/21/21 1311     Subjective Patient reports doing okay- Still feels like he could benefit from PT to help him with his balance. States he has been working on his walking with his cane.    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 S/p removal of trach/G tube on 5/27. He most recently presented to ED on 02/26/2021 due to nausea and vomiting. He was [facility] from 01/31/2021- 02/23/2021.    Limitations Lifting;Standing;Walking;House hold activities    How long can you sit comfortably? no issues    How long can you stand comfortably? < 5 min    How long can you walk comfortably? < 5 min     Patient Stated Goals I want to go back to work    Currently in Pain? No/denies             PAIN:   Denies   AROM: WNL with all BLE/UE     STRENGTH:  Graded on a 0-5 scale Muscle Group Left Right                          Hip Flex 4/5 4/5  Hip Abd 4/5 4/5  Hip Add 4/5 4/5  Hip Ext 4/5 4/5  Hip IR/ER 4/5 4/5  Knee Flex 4+/5 4+/5  Knee Ext 4/5 4+/5  Ankle DF 4/5 4/5  Ankle PF 4/5 4/5   SENSATION:    BUE : WNL BLE : WNL    FUNCTIONAL MOBILITY:   INDEPENDENT WITH BED MOBILITY and SIT TO STAND TRANSFERS  GAIT:  Patient demo ambulation in clinic today with small based quad cane, Close SBA, short reciprocal steps with mild unsteadiness yet no LOB.   OUTCOME MEASURES: TEST Outcome Interpretation  5 times sit<>stand 15.18 sec >60 yo, >15 sec indicates increased risk for falls  10 meter walk test                 m/s <1.0 m/s indicates increased risk for falls; limited community ambulator  Timed up and Go     17.80  sec <14 sec indicates increased risk for falls  6 minute walk test      750          Feet 1000 feet is community Financial controller 43/56 <36/56 (100% risk for falls), 37-45 (80% risk for falls); 46-51 (>50% risk for falls); 52-55 (lower risk <25% of falls)  FOTO 57%         [facility] PT Assessment - 08/21/21 1341       Berg Balance Test   Sit to Stand Able to stand without using hands and stabilize independently    Standing Unsupported Able to stand safely 2 minutes    Sitting with Back Unsupported but Feet Supported on Floor or Stool Able to sit safely and securely 2 minutes    Stand to Sit Sits safely with minimal use of hands    Transfers Able to transfer safely, minor use of hands    Standing Unsupported with Eyes Closed Able to stand 10 seconds safely    Standing Unsupported with Feet Together Able to place feet together independently and stand 1 minute safely    From Standing, Reach Forward with Outstretched Arm Can reach  confidently >25 cm (10")    From Standing Position, Pick up Object from Floor Able to pick up shoe, needs supervision    From Standing Position, Turn to Look Behind Over each Shoulder Turn sideways only but maintains balance    Turn 360 Degrees Able to turn 360 degrees safely but slowly    Standing Unsupported, Alternately Place Feet on Step/Stool Able to complete >2 steps/needs minimal assist    Standing Unsupported, One Foot in Front Able to take small step independently and hold 30 seconds    Standing on One Leg Tries to lift leg/unable to hold 3 seconds but remains standing independently    Total Score 43                                    PT Education - 08/21/21 1409     Education Details PT plan of care- Functional outcome testing    Person(s) Educated Patient    Methods Explanation;Demonstration;Tactile cues;Verbal cues    Comprehension Verbalized understanding;Returned demonstration;Verbal cues required;Tactile cues required;Need further instruction              PT Short Term Goals - 05/08/21 1017       PT SHORT TERM GOAL #1   Title Pt will be independent with HEP in order to improve strength and balance in order to decrease fall risk and improve function at home and work.    Baseline 03/02/2021- Patient with no formal HEP in place; 05/08/2021- Patient able to verbalize good understanding of current HEP involving strengthening and mobility including ambulation with cane.    Time 6    Period Weeks    Status Achieved    Target Date 04/13/21      PT SHORT TERM GOAL #2   Title Patient will demonstrate safe and modified independent sit to stand transfers for improved independence in the home and decreased caregiver assist.    Baseline 03/02/2021- Paitent requires min assist for safe transfers in clinic today; 05/08/2021= Patient able to demonstrate modified ind with all sit to stand transfers using BUE and a quad cane without difficulty.    Time 6     Period Weeks    Status Achieved    Target  Date 04/13/21               PT Long Term Goals - 08/21/21 1315       PT LONG TERM GOAL #1   Title Pt will improve FOTO to target score of 73 to display perceived improvements in ability to complete ADL's.    Baseline 03/02/2021= 58%; 05/29/2021= 52%; 08/21/2021= 57%    Time 12    Period Weeks    Status On-going    Target Date 11/13/21      PT LONG TERM GOAL #2   Title Pt will improve BERG by at least 13 points in order to demonstrate clinically significant improvement in balance.    Baseline 03/02/2021= 8/56; 05/29/2021= 37/56    Time 12    Period Weeks    Status Achieved      PT LONG TERM GOAL #3   Title Pt will decrease 5TSTS by at least 10 seconds in order to demonstrate clinically significant improvement in LE strength.    Baseline 03/02/2021=36.11 sec with BUE Support; 05/29/2021= 17.87 sec without UE support;    Time 12    Period Weeks    Status Achieved      PT LONG TERM GOAL #4   Title Pt will decrease TUG to below 18 seconds/decrease in order to demonstrate decreased fall risk.    Baseline 03/02/2021= 45.14 sec using RW; 05/29/2021= 24.68 sec with SBQC; 08/21/2021=    Time 12    Period Weeks    Status Achieved    Target Date 08/21/21      PT LONG TERM GOAL #5   Title Patient will be modified independent in walking on even/uneven surface with least restrictive assistive device, for 20+ minutes without rest break, reporting some difficulty or less to improve walking tolerance with community ambulation including grocery shopping, going to church,etc.    Baseline 03/02/2021= Patient limited to < 100 feet using RW: Patient currently walking around 300 feet with use of SBQC in clinic during sessions- not directly assessed today- will reassess next session. 08/21/2021- [facility] with close SBA for 750 feet.    Time 12    Period Weeks    Status On-going    Target Date 11/13/21      Additional Long Term Goals    Additional Long Term Goals Yes      PT LONG TERM GOAL #6   Title Pt will improve BERG by at least 7 points in order to demonstrate clinically significant improvement in balance.    Baseline 05/29/2021= 37/56; 08/21/2021= 43/56    Time 12    Period Weeks    Status On-going    Target Date 11/13/21      PT LONG TERM GOAL #7   Title Pt will increase 10MWT by at least 0.13 m/s in order to demonstrate clinically significant improvement in community ambulation.    Baseline 05/29/2021= 0.41 m/s; 08/21/2021= 0.68 m[facility]    Time 12    Period Weeks    Status Achieved    Target Date 08/21/21      PT LONG TERM GOAL #8   Title Pt will decrease 5TSTS by at least 3 seconds in order to demonstrate clinically significant improvement in LE strength.    Baseline 08/21/2021=    Time 12    Period Weeks    Status New    Target Date 11/13/21      PT LONG TERM GOAL  #9  TITLE Pt will decrease TUG to below 14 seconds/decrease in order to demonstrate decreased fall risk.    Baseline 08/21/2021= 17.80 sec with SBQC    Time 12    Period Weeks    Status New      PT LONG TERM GOAL  #10   TITLE Pt will increase by at least 78m (143ft) in order to demonstrate clinically significant improvement in cardiopulmonary endurance and community ambulation    Baseline 08/21/2021= 750 feet with SBQC    Time 12    Period Weeks    Status New                   Plan - 08/21/21 1410     Clinical Impression Statement Patient presents today with good motivation for today's reassessment. He has been working on his own to running out of approved visits through insurance for 2022. He returns back today and continues to present with some LE muscle weakness with impaired dynamic balance and decreased functional mobility. He did make some functional improvement as seen by improved 5 x STS, TUG, 10 Meter walk test, and FOTO. He will continue to benefit from skilled PT services to continue to progress his LE  strength and all functional mobility for improved household and community distances and improved overall quality of life. Patient's condition has the potential to improve in response to therapy. Maximum improvement is yet to be obtained. The anticipated improvement is attainable and reasonable in a generally predictable time.    Personal Factors and Comorbidities Comorbidity 1    Comorbidities diabetes    Examination-Activity Limitations Bathing;Bed Mobility;Bend;Caring for Others;Carry;Dressing;Lift;Locomotion Level;Squat;Stairs;Stand;Transfers    Examination-Participation Restrictions Cleaning;Community Activity;Driving;Occupation;Yard Work    Stability/Clinical Decision Making Stable/Uncomplicated    Rehab Potential Good    PT Frequency 1x / week    PT Duration 12 weeks    PT Treatment/Interventions ADLs/Self Care Home Management;Cryotherapy;Moist Heat;DME Instruction;Gait training;Stair training;Functional mobility training;Therapeutic activities;Therapeutic exercise;Balance training;Neuromuscular re-education;Patient/family education;Manual techniques;Passive range of motion;Dry needling;Canalith Repostioning;Vestibular    PT Next Visit Plan Continue with Balance and progress LE strengthening activities as appropriate.    PT Home Exercise Plan no changes    Consulted and Agree with Plan of Care Patient;Family member/caregiver;Other (Comment)   Lyoda, Interpreter   Family Member Consulted Wife             Patient will benefit from skilled therapeutic intervention in order to improve the following deficits and impairments:  Abnormal gait, Decreased activity tolerance, Decreased balance, Decreased coordination, Decreased endurance, Decreased mobility, Difficulty walking, Decreased strength  Visit Diagnosis: Abnormality of gait and mobility  Difficulty in walking, not elsewhere classified  Muscle weakness (generalized)  Other lack of coordination  Unsteadiness on  feet     Problem List There are no problems to display for this patient.   Lenda Kelp, PT 08/21/2021, 2:28 PM  [place] [facility] MAIN [facility] SERVICES [address] [place], [place], [address] Phone: [phone]   Fax:  [phone]  Name: Dennis Mcintosh MRN: [id number] Date of Birth: 20-Jan-1966

## 2021-08-27 ENCOUNTER — Ambulatory Visit: Payer: Medicaid Other

## 2021-09-04 ENCOUNTER — Ambulatory Visit: Payer: Medicaid Other | Attending: Physical Medicine and Rehabilitation | Admitting: Physical Therapy

## 2021-09-04 ENCOUNTER — Other Ambulatory Visit: Payer: Self-pay

## 2021-09-04 DIAGNOSIS — M6281 Muscle weakness (generalized): Secondary | ICD-10-CM | POA: Diagnosis present

## 2021-09-04 DIAGNOSIS — R269 Unspecified abnormalities of gait and mobility: Secondary | ICD-10-CM

## 2021-09-04 DIAGNOSIS — R2689 Other abnormalities of gait and mobility: Secondary | ICD-10-CM | POA: Diagnosis present

## 2021-09-04 DIAGNOSIS — R262 Difficulty in walking, not elsewhere classified: Secondary | ICD-10-CM

## 2021-09-04 DIAGNOSIS — R2681 Unsteadiness on feet: Secondary | ICD-10-CM

## 2021-09-04 NOTE — Therapy (Signed)
[place] [facility] MAIN [facility] SERVICES [address] [place], [place], [address] Phone: [phone]   Fax:  [phone]  Physical Therapy Treatment  Patient Details  Name: Dennis Mcintosh MRN: [id number] Date of Birth: [birth year] Referring Provider (PT): Dr. Windle Guard   Encounter Date: 09/04/2021   PT End of Session - 09/04/21 1109     Visit Number 18    Number of Visits 29    Date for PT Re-Evaluation 11/13/21    Authorization Time Period Initial Certification 03/02/2021-05/25/2021; Recert -08/21/2021; Recert 08/21/2021-11/13/2021    Progress Note Due on Visit 20    PT Start Time 1107    PT Stop Time 1145    PT Time Calculation (min) 38 min    Equipment Utilized During Treatment Gait belt    Activity Tolerance Patient tolerated treatment well    Behavior During Therapy WFL for tasks assessed/performed             Past Medical History:  Diagnosis Date   Diabetes mellitus without complication (HCC)     Past Surgical History:  Procedure Laterality Date   HEMORRHOID SURGERY      There were no vitals filed for this visit.   Subjective Assessment - 09/04/21 1239     Subjective States he has been working on his walking with his cane but mostly just been holding it in his hand and not using it for stability. No falls, stumbles, LOB.    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 S/p removal of trach/G tube on 5/27. He most recently presented to ED on 02/26/2021 due to nausea and vomiting. He was at [facility] rehab from 01/31/2021- 02/23/2021.    Limitations Lifting;Standing;Walking;House hold activities    How long can you sit comfortably? no issues    How long can you stand comfortably? < 5 min    How long can you walk comfortably? < 5 min    Patient Stated Goals I want to go back to work              Exercise/Activity Sets/ Reps/Time/ Personal assistant type Comments-Unless otherwise stated, CGA was provided and gait belt donned in order to ensure pt safety                           Airex step up 10 x ea  CGA Neuro re-ed Anterior, no UE assist, 5 x on ea LE, occassional Min A to prevent LOB, rated medium        STS progressions  4 x 5 reps CGA theract Progressed from normal STS to STS with 1 foot airex to STS with both feet on airex pad  Moderate difficulty with all tasks with airex pads under LE's   Hurdle step over - anterior 2 x 10 ea LE  CGA Neuro re ed  10 x with UE 10 x with UE- without pt had increased s/s of instability and required occasional CGA to prevent LOB  Increased difficulty on the R LE step overs  Biodex  CGA Theract  Biodex TM- gait trainer using UE support and gait belt-  Step length= _.46_ m RLE_.56_ m L Time on each foot 44__%R/_56_%L Gait velocity= .85m/s Total Time on TM = 5 min gait training plus 1 minute for warm up and pace selection for training  Treatment provided this session   Pt educated throughout session about proper posture and technique with exercises. Improved exercise technique, movement at target joints, use of target muscles after min to mod verbal, visual, tactile cues. Note: Portions of this document were prepared using Dragon voice recognition software and although reviewed may contain unintentional dictation errors in syntax, grammar, or spelling.                             PT Education - 09/04/21 1257     Education Details Gait mechanics    Person(s) Educated Patient    Methods Explanation    Comprehension Verbalized understanding              PT Short Term Goals - 05/08/21 1017       PT SHORT TERM GOAL #1   Title Pt will be independent with HEP in order to improve strength and balance in order to decrease fall risk and improve function at home and work.     Baseline 03/02/2021- Patient with no formal HEP in place; 05/08/2021- Patient able to verbalize good understanding of current HEP involving strengthening and mobility including ambulation with cane.    Time 6    Period Weeks    Status Achieved    Target Date 04/13/21      PT SHORT TERM GOAL #2   Title Patient will demonstrate safe and modified independent sit to stand transfers for improved independence in the home and decreased caregiver assist.    Baseline 03/02/2021- Paitent requires min assist for safe transfers in clinic today; 05/08/2021= Patient able to demonstrate modified ind with all sit to stand transfers using BUE and a quad cane without difficulty.    Time 6    Period Weeks    Status Achieved    Target Date 04/13/21               PT Long Term Goals - 08/21/21 1315       PT LONG TERM GOAL #1   Title Pt will improve FOTO to target score of 73 to display perceived improvements in ability to complete ADL's.    Baseline 03/02/2021= 58%; 05/29/2021= 52%; 08/21/2021= 57%    Time 12    Period Weeks    Status On-going    Target Date 11/13/21      PT LONG TERM GOAL #2   Title Pt will improve BERG by at least 13 points in order to demonstrate clinically significant improvement in balance.    Baseline 03/02/2021= 8/56; 05/29/2021= 37/56    Time 12    Period Weeks    Status Achieved      PT LONG TERM GOAL #3   Title Pt will decrease 5TSTS by at least 10 seconds in order to demonstrate clinically significant improvement in LE strength.    Baseline 03/02/2021=36.11 sec with BUE Support; 05/29/2021= 17.87 sec without UE support;    Time 12    Period Weeks    Status Achieved      PT LONG TERM GOAL #4   Title Pt will decrease TUG to below 18 seconds/decrease in order to demonstrate decreased fall risk.    Baseline 03/02/2021= 45.14 sec using RW; 05/29/2021= 24.68 sec with SBQC; 08/21/2021=    Time 12    Period Weeks    Status Achieved    Target Date 08/21/21      PT LONG TERM GOAL  #5   Title  Patient will be modified independent in walking on even/uneven surface with least restrictive assistive device, for 20+ minutes without rest break, reporting some difficulty or less to improve walking tolerance with community ambulation including grocery shopping, going to church,etc.    Baseline 03/02/2021= Patient limited to < 100 feet using RW: Patient currently walking around 300 feet with use of SBQC in clinic during sessions- not directly assessed today- will reassess next session. 08/21/2021- Patient ambulated in clinic using [facility] with close SBA for 750 feet.    Time 12    Period Weeks    Status On-going    Target Date 11/13/21      Additional Long Term Goals   Additional Long Term Goals Yes      PT LONG TERM GOAL #6   Title Pt will improve BERG by at least 7 points in order to demonstrate clinically significant improvement in balance.    Baseline 05/29/2021= 37/56; 08/21/2021= 43/56    Time 12    Period Weeks    Status On-going    Target Date 11/13/21      PT LONG TERM GOAL #7   Title Pt will increase by at least 0.13 m/s in order to demonstrate clinically significant improvement in community ambulation.    Baseline 05/29/2021= 0.41 m/s; 08/21/2021= 0.68 m/s with [facility]    Time 12    Period Weeks    Status Achieved    Target Date 08/21/21      PT LONG TERM GOAL #8   Title Pt will decrease 5TSTS by at least 3 seconds in order to demonstrate clinically significant improvement in LE strength.    Baseline 08/21/2021=    Time 12    Period Weeks    Status New    Target Date 11/13/21      PT LONG TERM GOAL  #9   TITLE Pt will decrease TUG to below 14 seconds/decrease in order to demonstrate decreased fall risk.    Baseline 08/21/2021= 17.80 sec with SBQC    Time 12    Period Weeks    Status New      PT LONG TERM GOAL  #10   TITLE Pt will increase by at least 31m (151ft) in order to demonstrate clinically significant improvement in cardiopulmonary endurance and  community ambulation    Baseline 08/21/2021= 750 feet with SBQC    Time 12    Period Weeks    Status New                   Plan - 09/04/21 1126     Clinical Impression Statement Pt presents with good motivation for completion of PT exercises and interventions.  Patient able to progress for several dynamic strengthening and balance activities in the session and overall tolerated well.  Patient does have impaired gait mechanics as measured with Biodex treadmill training and results as listed in the interventions and treatment section of this note.  Patient has began ambulating with cane in his hand but does not use it for stability and that she feels unstable.  Patient will continue to benefit from skilled physical therapy intervention in order to improve his strength, balance, mobility, quality of life, and to facilitate his return to normal daily activities.    Personal Factors and Comorbidities Comorbidity 1    Comorbidities diabetes    Examination-Activity Limitations Bathing;Bed Mobility;Bend;Caring for Others;Carry;Dressing;Lift;Locomotion Level;Squat;Stairs;Stand;Transfers    Examination-Participation Restrictions Cleaning;Community Activity;Driving;Occupation;Yard Work    Stability/Clinical Decision Making Stable/Uncomplicated  Rehab Potential Good    PT Frequency 1x / week    PT Duration 12 weeks    PT Treatment/Interventions ADLs/Self Care Home Management;Cryotherapy;Moist Heat;DME Instruction;Gait training;Stair training;Functional mobility training;Therapeutic activities;Therapeutic exercise;Balance training;Neuromuscular re-education;Patient/family education;Manual techniques;Passive range of motion;Dry needling;Canalith Repostioning;Vestibular    PT Next Visit Plan Continue with Balance and progress LE strengthening activities as appropriate.    PT Home Exercise Plan no changes    Consulted and Agree with Plan of Care Patient;Family member/caregiver;Other (Comment)    Lyoda, Interpreter   Family Member Consulted Wife             Patient will benefit from skilled therapeutic intervention in order to improve the following deficits and impairments:  Abnormal gait, Decreased activity tolerance, Decreased balance, Decreased coordination, Decreased endurance, Decreased mobility, Difficulty walking, Decreased strength  Visit Diagnosis: Abnormality of gait and mobility  Difficulty in walking, not elsewhere classified  Unsteadiness on feet  Other abnormalities of gait and mobility     Problem List There are no problems to display for this patient.   Norman Herrlichhristopher B Kingston Guiles, PT 09/04/2021, 1:06 PM  [place] [facility] [address] [place], [place], [address] Phone: [phone]   Fax:  [phone]  Name: Sarina IllMiguel Martinez Gutierrez MRN: [id number] Date of Birth: January 18, 1966

## 2021-09-14 ENCOUNTER — Ambulatory Visit: Payer: Medicaid Other

## 2021-09-18 ENCOUNTER — Ambulatory Visit: Payer: Medicaid Other

## 2021-09-18 ENCOUNTER — Other Ambulatory Visit: Payer: Self-pay

## 2021-09-18 DIAGNOSIS — R262 Difficulty in walking, not elsewhere classified: Secondary | ICD-10-CM

## 2021-09-18 DIAGNOSIS — R269 Unspecified abnormalities of gait and mobility: Secondary | ICD-10-CM

## 2021-09-18 DIAGNOSIS — R2681 Unsteadiness on feet: Secondary | ICD-10-CM

## 2021-09-18 DIAGNOSIS — M6281 Muscle weakness (generalized): Secondary | ICD-10-CM

## 2021-09-18 NOTE — Therapy (Signed)
[place] [facility] [address] [place], [place], [address] Phone: [phone]   Fax:  [phone]  Physical Therapy Treatment  Patient Details  Name: Dennis Mcintosh MRN: [id number] Date of Birth: 01-08-1966 Referring Provider (PT): Dr. Windle GuardKimberly Rauch   Encounter Date: 09/18/2021   PT End of Session - 09/18/21 1153     Visit Number 19    Number of Visits 29    Date for PT Re-Evaluation 11/13/21    Authorization Time Period Initial Certification 03/02/2021-05/25/2021; Recert -08/21/2021; Recert 08/21/2021-11/13/2021    Progress Note Due on Visit 20    PT Start Time 1155    PT Stop Time 1219    PT Time Calculation (min) 24 min    Equipment Utilized During Treatment Gait belt    Activity Tolerance Patient tolerated treatment well    Behavior [facility] Therapy [facility] for tasks assessed/performed             Past Medical History:  Diagnosis Date   Diabetes mellitus without complication (HCC)     Past Surgical History:  Procedure Laterality Date   HEMORRHOID SURGERY      There were no vitals filed for this visit.   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 S/p removal of trach/G tube on 5/27. He most recently presented to ED on 02/26/2021 due to nausea and vomiting. [facility] 02/23/2021.    Limitations Lifting;Standing;Walking;House hold activities    How long can you sit comfortably? no issues    How long can you stand comfortably? < 5 min    How long can you walk comfortably? < 5 min    Patient Stated Goals I want to go back to work    Currently in Pain? No/denies              Dynamic march while standing on blue airex pad- attempting without UE Support - x 1 min - Several bouts of  unsteadiness and reaching with UE support.    Lunge forward (from airex pad to BOSU) - standing on airex pad and lunge forward tapping foot onto BOSU x 12 steps each LE.   Soccer kick- Patient performed soccer kicking ball against wall while standing approx 5 feet from way- working on reactive stepping to kick ball x 3 min ( Difficulty with balance - reaching for UE support)   Tandem stand- One foot on airex pad and the front foot on another airex pad x 20 sec hold x 3   Staggered stand  on airex pads with trunk rotation (handing ball to PT on right side then twist back to left to receive ball from PT)   *Patient had to leave early secondary to having another medical appointment.                              PT Education - 09/18/21 1152     Education Details Exercise technique    Person(s) Educated Patient    Methods Explanation;Demonstration;Tactile cues;Verbal cues    Comprehension Verbalized understanding;Returned demonstration;Verbal cues required;Tactile cues required;Need further instruction              PT Short Term Goals - 05/08/21 1017       PT  SHORT TERM GOAL #1   Title Pt will be independent with HEP in order to improve strength and balance in order to decrease fall risk and improve function at home and work.    Baseline 03/02/2021- Patient with no formal HEP in place; 05/08/2021- Patient able to verbalize good understanding of current HEP involving strengthening and mobility including ambulation with cane.    Time 6    Period Weeks    Status Achieved    Target Date 04/13/21      PT SHORT TERM GOAL #2   Title Patient will demonstrate safe and modified independent sit to stand transfers for improved independence in the home and decreased caregiver assist.    Baseline 03/02/2021- Paitent requires min assist for safe transfers in clinic today; 05/08/2021= Patient able to demonstrate modified ind with all sit to stand transfers using BUE and a quad  cane without difficulty.    Time 6    Period Weeks    Status Achieved    Target Date 04/13/21               PT Long Term Goals - 08/21/21 1315       PT LONG TERM GOAL #1   Title Pt will improve FOTO to target score of 73 to display perceived improvements in ability to complete ADL's.    Baseline 03/02/2021= 58%; 05/29/2021= 52%; 08/21/2021= 57%    Time 12    Period Weeks    Status On-going    Target Date 11/13/21      PT LONG TERM GOAL #2   Title Pt will improve BERG by at least 13 points in order to demonstrate clinically significant improvement in balance.    Baseline 03/02/2021= 8/56; 05/29/2021= 37/56    Time 12    Period Weeks    Status Achieved      PT LONG TERM GOAL #3   Title Pt will decrease 5TSTS by at least 10 seconds in order to demonstrate clinically significant improvement in LE strength.    Baseline 03/02/2021=36.11 sec with BUE Support; 05/29/2021= 17.87 sec without UE support;    Time 12    Period Weeks    Status Achieved      PT LONG TERM GOAL #4   Title Pt will decrease TUG to below 18 seconds/decrease in order to demonstrate decreased fall risk.    Baseline 03/02/2021= 45.14 sec using RW; 05/29/2021= 24.68 sec with SBQC; 08/21/2021=    Time 12    Period Weeks    Status Achieved    Target Date 08/21/21      PT LONG TERM GOAL #5   Title Patient will be modified independent in walking on even/uneven surface with least restrictive assistive device, for 20+ minutes without rest break, reporting some difficulty or less to improve walking tolerance with community ambulation including grocery shopping, going to church,etc.    Baseline 03/02/2021= Patient limited to < 100 feet using RW: Patient currently walking around 300 feet with use of SBQC in clinic during sessions- not directly assessed today- will reassess next session. 08/21/2021- Patient ambulated in clinic using [facility] with close SBA for 750 feet.    Time 12    Period Weeks    Status On-going    Target Date  11/13/21      Additional Long Term Goals   Additional Long Term Goals Yes      PT LONG TERM GOAL #6   Title Pt will improve BERG by at least 7 points  in order to demonstrate clinically significant improvement in balance.    Baseline 05/29/2021= 37/56; 08/21/2021= 43/56    Time 12    Period Weeks    Status On-going    Target Date 11/13/21      PT LONG TERM GOAL #7   Title Pt will increase by at least 0.13 m/s in order to demonstrate clinically significant improvement in community ambulation.    Baseline 05/29/2021= 0.41 m/s; 08/21/2021= 0.68 m/s with [facility]    Time 12    Period Weeks    Status Achieved    Target Date 08/21/21      PT LONG TERM GOAL #8   Title Pt will decrease 5TSTS by at least 3 seconds in order to demonstrate clinically significant improvement in LE strength.    Baseline 08/21/2021=    Time 12    Period Weeks    Status New    Target Date 11/13/21      PT LONG TERM GOAL  #9   TITLE Pt will decrease TUG to below 14 seconds/decrease in order to demonstrate decreased fall risk.    Baseline 08/21/2021= 17.80 sec with SBQC    Time 12    Period Weeks    Status New      PT LONG TERM GOAL  #10   TITLE Pt will increase by at least 95m (129ft) in order to demonstrate clinically significant improvement in cardiopulmonary endurance and community ambulation    Baseline 08/21/2021= 750 feet with SBQC    Time 12    Period Weeks    Status New                   Plan - 09/18/21 1153     Clinical Impression Statement Patient presents with good motivation for PT session today- Patient was able to progress dynamic standing despite limited time today. He was able to demonstrate improving ability to stand longer on one leg as seen with dynamic marching and stepping. He was challenged with progressive balance including kicking soccer ball today. He will continue to benefit from skilled PT services to continue to progress his LE strength and all functional mobility  for improved household and community distances and improved overall quality of life.    Personal Factors and Comorbidities Comorbidity 1    Comorbidities diabetes    Examination-Activity Limitations Bathing;Bed Mobility;Bend;Caring for Others;Carry;Dressing;Lift;Locomotion Level;Squat;Stairs;Stand;Transfers    Examination-Participation Restrictions Cleaning;Community Activity;Driving;Occupation;Yard Work    Stability/Clinical Decision Making Stable/Uncomplicated    Rehab Potential Good    PT Frequency 1x / week    PT Duration 12 weeks    PT Treatment/Interventions ADLs/Self Care Home Management;Cryotherapy;Moist Heat;DME Instruction;Gait training;Stair training;Functional mobility training;Therapeutic activities;Therapeutic exercise;Balance training;Neuromuscular re-education;Patient/family education;Manual techniques;Passive range of motion;Dry needling;Canalith Repostioning;Vestibular    PT Next Visit Plan Continue with Balance and progress LE strengthening activities as appropriate.    PT Home Exercise Plan no changes    Consulted and Agree with Plan of Care Patient;Family member/caregiver;Other (Comment)   [place], Interpreter   Family Member Consulted Wife             Patient will benefit from skilled therapeutic intervention in order to improve the following deficits and impairments:  Abnormal gait, Decreased activity tolerance, Decreased balance, Decreased coordination, Decreased endurance, Decreased mobility, Difficulty walking, Decreased strength  Visit Diagnosis: Abnormality of gait and mobility  Difficulty in walking, not elsewhere classified  Muscle weakness (generalized)  Unsteadiness on feet     Problem List There are no  problems to display for this patient.   Lenda Kelp, PT 09/18/2021, 3:53 PM  [place] [facility] MAIN [facility] SERVICES [address] [place], [place], [address] Phone: [phone]   Fax:  [phone]  Name:  Dennis Mcintosh MRN: [id number] Date of Birth: 1965/10/27

## 2021-09-26 ENCOUNTER — Other Ambulatory Visit: Payer: Self-pay

## 2021-09-26 ENCOUNTER — Ambulatory Visit: Payer: Medicaid Other | Attending: Physical Medicine and Rehabilitation

## 2021-09-26 DIAGNOSIS — M6281 Muscle weakness (generalized): Secondary | ICD-10-CM | POA: Insufficient documentation

## 2021-09-26 DIAGNOSIS — R269 Unspecified abnormalities of gait and mobility: Secondary | ICD-10-CM | POA: Diagnosis not present

## 2021-09-26 DIAGNOSIS — R278 Other lack of coordination: Secondary | ICD-10-CM | POA: Diagnosis present

## 2021-09-26 DIAGNOSIS — R262 Difficulty in walking, not elsewhere classified: Secondary | ICD-10-CM | POA: Insufficient documentation

## 2021-09-26 DIAGNOSIS — R2681 Unsteadiness on feet: Secondary | ICD-10-CM | POA: Insufficient documentation

## 2021-09-26 DIAGNOSIS — J383 Other diseases of vocal cords: Secondary | ICD-10-CM | POA: Diagnosis present

## 2021-09-26 DIAGNOSIS — R490 Dysphonia: Secondary | ICD-10-CM | POA: Insufficient documentation

## 2021-09-26 NOTE — Therapy (Signed)
[place] [facility] MAIN [place] [facility] SERVICES [address] [place], [place], [address] Phone: [phone]   Fax:  [phone]  Physical Therapy Treatment/Physical Therapy Progress Note   Dates of reporting period 05/08/2021    to   09/26/2021  Patient Details  Name: Dennis Mcintosh MRN: [id number] Date of Birth: 06-04-1966 Referring Provider (PT): Dr. Windle Guard   Encounter Date: 09/26/2021   PT End of Session - 09/26/21 1459     Visit Number 20    Number of Visits 29    Date for PT Re-Evaluation 11/13/21    Authorization Time Period Initial Certification 03/02/2021-05/25/2021; Recert -08/21/2021; Recert 08/21/2021-11/13/2021    Progress Note Due on Visit 20    PT Start Time 0932    PT Stop Time 1013    PT Time Calculation (min) 41 min    Equipment Utilized During Treatment Gait belt    Activity Tolerance Patient tolerated treatment well    Behavior During Therapy [facility] for tasks assessed/performed             Past Medical History:  Diagnosis Date   Diabetes mellitus without complication (HCC)     Past Surgical History:  Procedure Laterality Date   HEMORRHOID SURGERY      There were no vitals filed for this visit.   Subjective Assessment - 09/26/21 0958     Subjective Patient reports 1 fall since last visit- While performing squats and went too low and unable to stand. He denied any pain associated with fall and reports doing well overall today.    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 S/p removal of trach/G tube on 5/27. He most recently presented to ED on 02/26/2021 due to nausea and vomiting. He was at [facility] rehab from 01/31/2021- 02/23/2021.    Limitations Lifting;Standing;Walking;House hold activities    How long can you sit comfortably? no issues    How long can you stand  comfortably? < 5 min    How long can you walk comfortably? < 5 min    Patient Stated Goals I want to go back to work    Currently in Pain? No/denies                [facility] PT Assessment - 09/26/21 1457       Berg Balance Test   Sit to Stand Able to stand without using hands and stabilize independently    Standing Unsupported Able to stand safely 2 minutes    Sitting with Back Unsupported but Feet Supported on Floor or Stool Able to sit safely and securely 2 minutes    Stand to Sit Sits safely with minimal use of hands    Transfers Able to transfer safely, minor use of hands    Standing Unsupported with Eyes Closed Able to stand 10 seconds safely    Standing Unsupported with Feet Together Able to place feet together independently and stand 1 minute safely    From Standing, Reach Forward with Outstretched Arm Can reach forward >12 cm safely (5")    From Standing Position, Pick up Object from Floor Able to pick up shoe, needs supervision    From Standing Position, Turn to Look Behind Over each Shoulder Looks behind from both sides and weight shifts well    Turn 360 Degrees Able to turn 360 degrees safely but slowly    Standing  Unsupported, Alternately Place Feet on Step/Stool Able to stand independently and complete 8 steps >20 seconds    Standing Unsupported, One Foot in Front Able to plae foot ahead of the other independently and hold 30 seconds    Standing on One Leg Tries to lift leg/unable to hold 3 seconds but remains standing independently    Total Score 47           INTERVENTIONS:   Reassessment of goals for Progress note:  5x STS= 13.88 sec without UE support  6 min walk= 960 feet with use of quad cane  BERG Balance= 47/56 (Improved from 43/56)   10 MWT=  11.49 sec and 10.36=10.92 sec= 0.91 m/s  TUG= 15.01 sec with [facility]                        PT Education - 09/26/21 1459     Education Details Purpose of functional outcome testing     Person(s) Educated Patient    Methods Explanation;Demonstration;Tactile cues;Verbal cues    Comprehension Verbalized understanding;Returned demonstration;Verbal cues required;Tactile cues required;Need further instruction              PT Short Term Goals - 05/08/21 1017       PT SHORT TERM GOAL #1   Title Pt will be independent with HEP in order to improve strength and balance in order to decrease fall risk and improve function at home and work.    Baseline 03/02/2021- Patient with no formal HEP in place; 05/08/2021- Patient able to verbalize good understanding of current HEP involving strengthening and mobility including ambulation with cane.    Time 6    Period Weeks    Status Achieved    Target Date 04/13/21      PT SHORT TERM GOAL #2   Title Patient will demonstrate safe and modified independent sit to stand transfers for improved independence in the home and decreased caregiver assist.    Baseline 03/02/2021- Paitent requires min assist for safe transfers in clinic today; 05/08/2021= Patient able to demonstrate modified ind with all sit to stand transfers using BUE and a quad cane without difficulty.    Time 6    Period Weeks    Status Achieved    Target Date 04/13/21               PT Long Term Goals - 09/26/21 1501       PT LONG TERM GOAL #1   Title Pt will improve FOTO to target score of 73 to display perceived improvements in ability to complete ADL's.    Baseline 03/02/2021= 58%; 05/29/2021= 52%; 08/21/2021= 57%    Time 12    Period Weeks    Status On-going    Target Date 11/13/21      PT LONG TERM GOAL #2   Title Pt will improve BERG by at least 13 points in order to demonstrate clinically significant improvement in balance.    Baseline 03/02/2021= 8/56; 05/29/2021= 37/56    Time 12    Period Weeks    Status Achieved      PT LONG TERM GOAL #3   Title Pt will decrease 5TSTS by at least 10 seconds in order to demonstrate clinically significant improvement in LE  strength.    Baseline 03/02/2021=36.11 sec with BUE Support; 05/29/2021= 17.87 sec without UE support;    Time 12    Period Weeks    Status Achieved  PT LONG TERM GOAL #4   Title Pt will decrease TUG to below 18 seconds/decrease in order to demonstrate decreased fall risk.    Baseline 03/02/2021= 45.14 sec using RW; 05/29/2021= 24.68 sec with SBQC; 08/21/2021=17.18 sec    Time 12    Period Weeks    Status Achieved    Target Date 08/21/21      PT LONG TERM GOAL #5   Title Patient will be modified independent in walking on even/uneven surface with least restrictive assistive device, for 20+ minutes without rest break, reporting some difficulty or less to improve walking tolerance with community ambulation including grocery shopping, going to church,etc.    Baseline 03/02/2021= Patient limited to < 100 feet using RW: Patient currently walking around 300 feet with use of SBQC in clinic during sessions- not directly assessed today- will reassess next session. 08/21/2021- Patient ambulated in clinic using [facility] with close SBA for 750 feet.    Time 12    Period Weeks    Status On-going    Target Date 11/13/21      PT LONG TERM GOAL #6   Title Pt will improve BERG by at least 7 points in order to demonstrate clinically significant improvement in balance.    Baseline 05/29/2021= 37/56; 08/21/2021= 43/56; 09/26/2021= 47/56    Time 12    Period Weeks    Status On-going    Target Date 11/13/21      PT LONG TERM GOAL #7   Title Pt will increase by at least 0.13 m/s in order to demonstrate clinically significant improvement in community ambulation.    Baseline 05/29/2021= 0.41 m/s; 08/21/2021= 0.68 m/s with SBQC; 09/26/2021= 0.91 m/s using QC    Time 12    Period Weeks    Status Achieved    Target Date 08/21/21      PT LONG TERM GOAL #8   Title Pt will decrease 5TSTS by at least 3 seconds in order to demonstrate clinically significant improvement in LE strength.    Baseline 08/21/2021=15.18 sec;  09/26/2021= 13.88 sec without an AD    Time 12    Period Weeks    Status On-going    Target Date 11/13/21      PT LONG TERM GOAL  #9   TITLE Pt will decrease TUG to below 14 seconds/decrease in order to demonstrate decreased fall risk.    Baseline 08/21/2021= 17.80 sec with SBQC; 09/26/2021= 15.01 sec    Time 12    Period Weeks    Status New    Target Date 11/13/21      PT LONG TERM GOAL  #10   TITLE Pt will increase by at least 82m (142ft) in order to demonstrate clinically significant improvement in cardiopulmonary endurance and community ambulation    Baseline 08/21/2021= 750 feet with SBQC; 3/1/203= 960 feet with use of SBQC    Time 12    Period Weeks    Status On-going    Target Date 11/13/21                   Plan - 09/26/21 1500     Clinical Impression Statement Patient presents with excellent motivation today and able to demonstrate good progress with several goals. He continues to decrease his Timed Up and go time and improve his overall gait velocity and endurance with 6 min walk test. He demo over 200 feet improvement 6 min walk test indicating a significant improvement overall. Patient's condition has the  potential to improve in response to therapy. Maximum improvement is yet to be obtained. The anticipated improvement is attainable and reasonable in a generally predictable time.    Personal Factors and Comorbidities Comorbidity 1    Comorbidities diabetes    Examination-Activity Limitations Bathing;Bed Mobility;Bend;Caring for Others;Carry;Dressing;Lift;Locomotion Level;Squat;Stairs;Stand;Transfers    Examination-Participation Restrictions Cleaning;Community Activity;Driving;Occupation;Yard Work    Stability/Clinical Decision Making Stable/Uncomplicated    Rehab Potential Good    PT Frequency 1x / week    PT Duration 12 weeks    PT Treatment/Interventions ADLs/Self Care Home Management;Cryotherapy;Moist Heat;DME Instruction;Gait training;Stair  training;Functional mobility training;Therapeutic activities;Therapeutic exercise;Balance training;Neuromuscular re-education;Patient/family education;Manual techniques;Passive range of motion;Dry needling;Canalith Repostioning;Vestibular    PT Next Visit Plan Continue with Balance and progress LE strengthening activities as appropriate.    PT Home Exercise Plan no changes    Consulted and Agree with Plan of Care Patient;Family member/caregiver;Other (Comment)   [place], Interpreter   Family Member Consulted Wife             Patient will benefit from skilled therapeutic intervention in order to improve the following deficits and impairments:  Abnormal gait, Decreased activity tolerance, Decreased balance, Decreased coordination, Decreased endurance, Decreased mobility, Difficulty walking, Decreased strength  Visit Diagnosis: Abnormality of gait and mobility  Difficulty in walking, not elsewhere classified  Muscle weakness (generalized)  Unsteadiness on feet     Problem List There are no problems to display for this patient.   Lenda Kelp, PT 09/26/2021, 3:11 PM  [place] [facility] MAIN [facility] SERVICES [address] [place], [place], [address] Phone: [phone]   Fax:  [phone]  Name: Dennis Mcintosh MRN: [id number] Date of Birth: [birth year]

## 2021-10-01 ENCOUNTER — Ambulatory Visit: Payer: Medicaid Other | Admitting: Speech Pathology

## 2021-10-01 ENCOUNTER — Other Ambulatory Visit: Payer: Self-pay

## 2021-10-01 DIAGNOSIS — R269 Unspecified abnormalities of gait and mobility: Secondary | ICD-10-CM | POA: Diagnosis not present

## 2021-10-01 DIAGNOSIS — R490 Dysphonia: Secondary | ICD-10-CM

## 2021-10-01 DIAGNOSIS — J383 Other diseases of vocal cords: Secondary | ICD-10-CM

## 2021-10-02 NOTE — Therapy (Signed)
[place] ?[facility] ?[address] ?[place], [place], [address] ?Phone: [phone]   Fax:  [phone] ? ?Speech Language Pathology Evaluation ? ?Patient Details  ?Name: Dennis Mcintosh ?MRN: [id number] ?Date of Birth: 31-Aug-1965 ?Referring Provider (SLP): Windle Guard ? ? ?Encounter Date: 10/01/2021 ? ? End of Session - 10/02/21 0818   ? ? Visit Number 1   ? Number of Visits 24   ? Date for SLP Re-Evaluation 12/25/21   ? Authorization Type Medicaid   ? Authorization Time Period 10/01/2021 thru 12/25/2021   ? Authorization - Visit Number 1   ? Progress Note Due on Visit 10   ? SLP Start Time 1400   ? SLP Stop Time  1500   ? SLP Time Calculation (min) 60 min   ? Activity Tolerance Patient tolerated treatment well   ? ?  ?  ? ?  ? ? ?Past Medical History:  ?Diagnosis Date  ? Diabetes mellitus without complication (HCC)   ? ? ?Past Surgical History:  ?Procedure Laterality Date  ? HEMORRHOID SURGERY    ? ? ?There were no vitals filed for this visit. ? ? Subjective Assessment - 10/02/21 0810   ? ? Subjective pt pleasant, accompanied by his wife and an interpreter   ? Patient is accompained by: Family member;Interpreter   ? Currently in Pain? No/denies   ? ?  ?  ? ?  ? ? ? ? ? SLP Evaluation [facility] - 10/02/21 0810   ? ?  ? SLP Visit Information  ? SLP Received On 10/01/21   ? Referring Provider (SLP) Windle Guard   ? Onset Date 12/06/2020   ? Medical Diagnosis Dysphonia   ?  ? General Information  ? HPI Dennis Mcintosh is a 56 y.o. 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 Pt intubated for 13 days prior to trach. Pt attended inpatient rehab (01/31/2021 thru 02/23/2021) and has seen been decannulated with PEG removed. Of note, pt had audiological work-up on 03/02/2021 that revealed:  RIGHT Ear: mild to profound  sensorineural hearing loss   Speech Recognition Threshold (SRT): 35 dB HL   Word Recognition Score (Recorded Spanish word list): 100% at 90 dB HL  LEFT Ear: Severe to profound sensorineural hearing loss   Speech Recognition Threshold (SRT): 95 dB HL (contralateral ear masked)   Word Recognition Score (Recorded NU-6): 0% at 110 dB HL (contralateral ear masked). Referral made to Speech Therapy on 03/28/2021 by pt's neurologist for moderate dysphonia. Of note, pt was evaluated by Dr Vergie Living (Otolaryngology at [facility]) who found that his voice disorders appears to be secondary to injury following endotracheal intubation. He has a complex airway scenario with limited mobility of the vocal fold and an erosive posterior glottic gap-difficult to reconstruct operatively. We have agreed that we will begin with conservative measures for voice rehabilitation, followed by potential trial of glottic surgical intervention. In addition he found that pt's Right VF immobility and Left VF hypomobility.   ?  ? Prior Functional Status  ? Cognitive/Linguistic Baseline Within functional limits   ?  ? Cognition  ? Overall Cognitive Status Within Functional Limits for tasks assessed   ?  ? Auditory Comprehension  ? Overall Auditory Comprehension Appears within functional limits for tasks assessed   ?  ? Visual Recognition/Discrimination  ? Discrimination Not tested   ?  ?  Reading Comprehension  ? Reading Status Not tested   ?  ? Expression  ? Primary Mode of Expression Verbal   ?  ? Verbal Expression  ? Overall Verbal Expression Appears within functional limits for tasks assessed   ?  ? Oral Motor/Sensory Function  ? Overall Oral Motor/Sensory Function Appears within functional limits for tasks assessed   ?  ? Motor Speech  ? Overall Motor Speech Impaired   ? Respiration Impaired   ? Level of Impairment Phrase   ? Phonation Breathy;Hoarse;Low vocal intensity;Aphonic   ? Resonance Within functional limits   ? Articulation Within functional  limitis   ? Intelligibility Intelligibility reduced   ? Word 75-100% accurate   ? Phrase 50-74% accurate   ? Sentence 50-74% accurate   ? Conversation 50-74% accurate   ? Motor Planning Witnin functional limits   ? Motor Speech Errors Not applicable   ? Interfering Components Hearing loss   ? Effective Techniques Increased vocal intensity   ? Phonation Impaired   ? Volume Soft;Decibel Level   ? Pitch Aphonic   ?  ? Standardized Assessments  ? Standardized Assessments  --   Perceptual Voice Evaluation  ? ?  ?  ? ?  ? ? ? ? ? ? ? ? ? ? ? ? ? ? ? ? ? ? SLP Education - 10/02/21 0817   ? ? Education Details results of evaluation, POC, VC adduction exercises   ? Person(s) Educated Patient;Spouse   ? Methods Explanation;Demonstration;Verbal cues;Handout;Tactile cues   ? Comprehension Verbalized understanding;Returned demonstration;Need further instruction   ? ?  ?  ? ?  ? ? ? SLP Short Term Goals - 10/02/21 0824   ? ?  ? SLP SHORT TERM GOAL #1  ? Title The patient will maximize voice quality and loudness using breath support for sustained vowel production, pitch glides, and hierarchal speech drill   ? Baseline severe impairments ~ 1 word per breath   ? Time 10   ? Period --   sessions  ? Status New   ?  ? SLP SHORT TERM GOAL #2  ? Title The patient will eliminate phonotraumatic behaviors such as chronic throat clearing, by substituting non-traumatic methods to clear mucus.   ? Baseline moderate severity   ? Time 10   ? Period --   sessions  ? Status New   ? ?  ?  ? ?  ? ? ? SLP Long Term Goals - 10/02/21 0825   ? ?  ? SLP LONG TERM GOAL #1  ? Title Pt will improve vocal quality, vocal hygiene, voice projection, and prevent vocal fatigue.   ? Baseline moderate to severe dysphonia resulting in < 50% speech intelligibility   ? Time 12   ? Period Weeks   ? Status New   ? Target Date 12/25/21   ? ?  ?  ? ?  ? ? ? Plan - 10/02/21 0819   ? ? Clinical Impression Statement Pt presents for voice therapy after being evaluated by  Otolaryngologist Dr Vergie Living. 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 Pt continues to present with moderate dysphonia that is c/b hoarse breathy vocal quality that is low in intensity resulting in decreased speech intelligibility of ~ 50% at the conversation level. Pt responded well to initial vocal cord adduction activities and appears to be a good candidate for voice therapy. As such recommend a  course of skilled ST intervention to train pt in vocal hygiene, improve breath support for voice and VC adduction exercises in order to improve vocal quality, endurance, and quality of life.   ? Speech Therapy Frequency 2x / week   ? Duration 12 weeks   ? Treatment/Interventions SLP instruction and feedback;Patient/family education   ? Potential to Achieve Goals Good   ? SLP Home Exercise Plan provided, see pt instructions section   ? Consulted and Agree with Plan of Care Patient;Family member/caregiver   ? Family Member Consulted pt's wife   ? ?  ?  ? ?  ? ? ?Patient will benefit from skilled therapeutic intervention in order to improve the following deficits and impairments:   ?Dysphonia ? ?Vocal cord dysfunction ? ? ? ?Problem List ?There are no problems to display for this patient. ? ?Evanna Washinton B. Dreama Saa, M.S., CCC-SLP, CBIS ?Speech-Language Pathologist ?Rehabilitation Services ?Office [phone] ? ?Salwa Bai, CCC-SLP ?10/02/2021, 8:26 AM ? ?[place] ?[facility] ?[address] ?[place], [place], [address] ?Phone: [phone]   Fax:  [phone] ? ?Name: Dennis Mcintosh ?MRN: [id number] ?Date of Birth: [birth year] ?

## 2021-10-02 NOTE — Patient Instructions (Signed)
Using a moderately loud, good quality voice, say numbers 1-10, DOW, MOY 3 times a day ?

## 2021-10-04 ENCOUNTER — Other Ambulatory Visit: Payer: Self-pay

## 2021-10-04 ENCOUNTER — Ambulatory Visit: Payer: Medicaid Other

## 2021-10-04 ENCOUNTER — Ambulatory Visit: Payer: Medicaid Other | Admitting: Speech Pathology

## 2021-10-04 DIAGNOSIS — R2681 Unsteadiness on feet: Secondary | ICD-10-CM

## 2021-10-04 DIAGNOSIS — M6281 Muscle weakness (generalized): Secondary | ICD-10-CM

## 2021-10-04 DIAGNOSIS — R269 Unspecified abnormalities of gait and mobility: Secondary | ICD-10-CM

## 2021-10-04 DIAGNOSIS — R490 Dysphonia: Secondary | ICD-10-CM

## 2021-10-04 DIAGNOSIS — R262 Difficulty in walking, not elsewhere classified: Secondary | ICD-10-CM

## 2021-10-04 DIAGNOSIS — J383 Other diseases of vocal cords: Secondary | ICD-10-CM

## 2021-10-04 DIAGNOSIS — R278 Other lack of coordination: Secondary | ICD-10-CM

## 2021-10-04 NOTE — Therapy (Signed)
[facility] [address] [place], [place], [address] Phone: [phone]   Fax:  [phone]  Physical Therapy Treatment  Patient Details  Name: Dennis Mcintosh MRN: [id number] Date of Birth: November 11, 1965 Referring Provider (PT): Dr. Windle GuardKimberly Rauch   Encounter Date: 10/04/2021   PT End of Session - 10/04/21 1318     Visit Number 21    Number of Visits 29    Date for PT Re-Evaluation 11/13/21    Authorization Time Period Initial Certification 03/02/2021-05/25/2021; Recert -08/21/2021; Recert 08/21/2021-11/13/2021    Progress Note Due on Visit 30    PT Start Time 1203    PT Stop Time 1231    PT Time Calculation (min) 28 min    Equipment Utilized During Treatment Gait belt    Activity Tolerance Patient tolerated treatment well    Behavior During Therapy WFL for tasks assessed/performed             Past Medical History:  Diagnosis Date   Diabetes mellitus without complication (HCC)     Past Surgical History:  Procedure Laterality Date   HEMORRHOID SURGERY      There were no vitals filed for this visit.   Subjective Assessment - 10/04/21 1316     Subjective Patient reports having a good week without any falls or issues. Reports he [facility] and states he is working on his balance exercises consistently.    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 S/p removal of trach/G tube on 5/27. He most recently presented to ED on 02/26/2021 due to nausea and vomiting. [facility]- 02/23/2021.    Limitations Lifting;Standing;Walking;House hold activities    How long can you sit comfortably? no issues    How long can you stand comfortably? < 5 min    How long can you walk comfortably? < 5 min    Patient Stated Goals I want to  go back to work    Currently in Pain? No/denies            INTERVENTIONS:   Neuromuscular re-education:   Static stand on blue airex pad x 20 sec with feet narrowed x 3   Dynamic marching in place on blue airex pad x 20 reps x 2 trials - focusing on slow/controlled tempo.   Sit to stand x 10 reps without UE support   Cone activities- 4 cones set up in approx 8 x8 square- performing forward/side step/backward stepping with CGA and use of gait belt x multiple attempts- Most difficulty with backward stepping today.   Tandem standing- hold 20 sec x each leg x multiple attempts - mild initial unsteadiness yet improved with practice.   Single leg stance - hold 5-10 sec each leg x multiple attempts- at best able to hold x 10 sec each leg on last set.   Education provided throughout session via VC/TC and demonstration to facilitate movement at target joints and correct muscle activation for all testing and exercises performed.                             PT Education - 10/04/21 1318     Education Details Exercise technique    Person(s) Educated Patient    Methods Explanation;Demonstration;Tactile cues;Verbal cues  Comprehension Verbalized understanding;Returned demonstration;Verbal cues required;Tactile cues required;Need further instruction              PT Short Term Goals - 05/08/21 1017       PT SHORT TERM GOAL #1   Title Pt will be independent with HEP in order to improve strength and balance in order to decrease fall risk and improve function at home and work.    Baseline 03/02/2021- Patient with no formal HEP in place; 05/08/2021- Patient able to verbalize good understanding of current HEP involving strengthening and mobility including ambulation with cane.    Time 6    Period Weeks    Status Achieved    Target Date 04/13/21      PT SHORT TERM GOAL #2   Title Patient will demonstrate safe and modified independent sit to stand transfers for  improved independence in the home and decreased caregiver assist.    Baseline 03/02/2021- Paitent requires min assist for safe transfers in clinic today; 05/08/2021= Patient able to demonstrate modified ind with all sit to stand transfers using BUE and a quad cane without difficulty.    Time 6    Period Weeks    Status Achieved    Target Date 04/13/21               PT Long Term Goals - 09/26/21 1501       PT LONG TERM GOAL #1   Title Pt will improve FOTO to target score of 73 to display perceived improvements in ability to complete ADL's.    Baseline 03/02/2021= 58%; 05/29/2021= 52%; 08/21/2021= 57%    Time 12    Period Weeks    Status On-going    Target Date 11/13/21      PT LONG TERM GOAL #2   Title Pt will improve BERG by at least 13 points in order to demonstrate clinically significant improvement in balance.    Baseline 03/02/2021= 8/56; 05/29/2021= 37/56    Time 12    Period Weeks    Status Achieved      PT LONG TERM GOAL #3   Title Pt will decrease 5TSTS by at least 10 seconds in order to demonstrate clinically significant improvement in LE strength.    Baseline 03/02/2021=36.11 sec with BUE Support; 05/29/2021= 17.87 sec without UE support;    Time 12    Period Weeks    Status Achieved      PT LONG TERM GOAL #4   Title Pt will decrease TUG to below 18 seconds/decrease in order to demonstrate decreased fall risk.    Baseline 03/02/2021= 45.14 sec using RW; 05/29/2021= 24.68 sec with SBQC; 08/21/2021=17.18 sec    Time 12    Period Weeks    Status Achieved    Target Date 08/21/21      PT LONG TERM GOAL #5   Title Patient will be modified independent in walking on even/uneven surface with least restrictive assistive device, for 20+ minutes without rest break, reporting some difficulty or less to improve walking tolerance with community ambulation including grocery shopping, going to church,etc.    Baseline 03/02/2021= Patient limited to < 100 feet using RW: Patient currently  walking around 300 feet with use of SBQC in clinic during sessions- not directly assessed today- will reassess next session. 08/21/2021- Patient ambulated in clinic using [facility] with close SBA for 750 feet.    Time 12    Period Weeks    Status On-going    Target Date 11/13/21  PT LONG TERM GOAL #6   Title Pt will improve BERG by at least 7 points in order to demonstrate clinically significant improvement in balance.    Baseline 05/29/2021= 37/56; 08/21/2021= 43/56; 09/26/2021= 47/56    Time 12    Period Weeks    Status On-going    Target Date 11/13/21      PT LONG TERM GOAL #7   Title Pt will increase by at least 0.13 m/s in order to demonstrate clinically significant improvement in community ambulation.    Baseline 05/29/2021= 0.41 m/s; 08/21/2021= 0.68 m/s with SBQC; 09/26/2021= 0.91 m/s using QC    Time 12    Period Weeks    Status Achieved    Target Date 08/21/21      PT LONG TERM GOAL #8   Title Pt will decrease 5TSTS by at least 3 seconds in order to demonstrate clinically significant improvement in LE strength.    Baseline 08/21/2021=15.18 sec; 09/26/2021= 13.88 sec without an AD    Time 12    Period Weeks    Status On-going    Target Date 11/13/21      PT LONG TERM GOAL  #9   TITLE Pt will decrease TUG to below 14 seconds/decrease in order to demonstrate decreased fall risk.    Baseline 08/21/2021= 17.80 sec with SBQC; 09/26/2021= 15.01 sec    Time 12    Period Weeks    Status New    Target Date 11/13/21      PT LONG TERM GOAL  #10   TITLE Pt will increase by at least 27m (155ft) in order to demonstrate clinically significant improvement in cardiopulmonary endurance and community ambulation    Baseline 08/21/2021= 750 feet with SBQC; 3/1/203= 960 feet with use of SBQC    Time 12    Period Weeks    Status On-going    Target Date 11/13/21                   Plan - 10/04/21 1319     Clinical Impression Statement Treatment limited secondary to patient was 18  min late for arriva. He was well motivated and participated well with neuro re-ed today. He was able to demo improved single leg stance balance and demo improvement with cone activity with practice and VC.He will continue to benefit from skilled PT services to continue to progress his LE strength and all functional mobility for improved household and community distances and improved overall quality of life.    Personal Factors and Comorbidities Comorbidity 1    Comorbidities diabetes    Examination-Activity Limitations Bathing;Bed Mobility;Bend;Caring for Others;Carry;Dressing;Lift;Locomotion Level;Squat;Stairs;Stand;Transfers    Examination-Participation Restrictions Cleaning;Community Activity;Driving;Occupation;Yard Work    Stability/Clinical Decision Making Stable/Uncomplicated    Rehab Potential Good    PT Frequency 1x / week    PT Duration 12 weeks    PT Treatment/Interventions ADLs/Self Care Home Management;Cryotherapy;Moist Heat;DME Instruction;Gait training;Stair training;Functional mobility training;Therapeutic activities;Therapeutic exercise;Balance training;Neuromuscular re-education;Patient/family education;Manual techniques;Passive range of motion;Dry needling;Canalith Repostioning;Vestibular    PT Next Visit Plan Continue with Balance and progress LE strengthening activities as appropriate.    PT Home Exercise Plan no changes    Consulted and Agree with Plan of Care Patient;Family member/caregiver;Other (Comment)   [place], Interpreter   Family Member Consulted Wife             Patient will benefit from skilled therapeutic intervention in order to improve the following deficits and impairments:  Abnormal gait, Decreased activity tolerance, Decreased  balance, Decreased coordination, Decreased endurance, Decreased mobility, Difficulty walking, Decreased strength  Visit Diagnosis: Abnormality of gait and mobility  Difficulty in walking, not elsewhere classified  Muscle weakness  (generalized)  Other lack of coordination  Unsteadiness on feet     Problem List There are no problems to display for this patient.   Lenda Kelp, PT 10/04/2021, 1:34 PM  [place] [facility] MAIN [facility] SERVICES [address] [place], [place], [address] Phone: [phone]   Fax:  [phone]  Name: Antwaine Boomhower MRN: [id number] Date of Birth: 1966/07/12

## 2021-10-06 NOTE — Therapy (Signed)
[place] ?[facility] ?[address] ?[place], [place], [address] ?Phone: [phone]   Fax:  [phone] ? ?Speech Language Pathology Treatment ? ?Patient Details  ?Name: Dennis Mcintosh ?MRN: [id number] ?Date of Birth: [birth year] ?Referring Provider (SLP): Windle Guard ? ? ?Encounter Date: 10/04/2021 ? ? End of Session - 10/06/21 2143   ? ? Visit Number 2   ? Number of Visits 24   ? Date for SLP Re-Evaluation 12/25/21   ? Authorization Type Medicaid   ? Authorization Time Period 10/01/2021 thru 12/25/2021   ? Authorization - Visit Number 2   ? Progress Note Due on Visit 10   ? SLP Start Time 1230   ? SLP Stop Time  1315   ? SLP Time Calculation (min) 45 min   ? Activity Tolerance Patient tolerated treatment well   ? ?  ?  ? ?  ? ? ?Past Medical History:  ?Diagnosis Date  ? Diabetes mellitus without complication (HCC)   ? ? ?Past Surgical History:  ?Procedure Laterality Date  ? HEMORRHOID SURGERY    ? ? ?There were no vitals filed for this visit. ? ? Subjective Assessment - 10/06/21 2102   ? ? Subjective pt pleasant, accompanied by his wife and an interpreter   ? Patient is accompained by: Family member;Interpreter   ? Currently in Pain? No/denies   ? ?  ?  ? ?  ? ? ? ? ? ? ? ? ADULT SLP TREATMENT - 10/06/21 0001   ? ?  ? Treatment Provided  ? Treatment provided Cognitive-Linquistic   ?  ? Cognitive-Linquistic Treatment  ? Treatment focused on Voice;Patient/family/caregiver education   ? Skilled Treatment Skilled treatment session focused on addressisng concerns voiced by pt, his wife and those mentioned by pt's daughters. They are concerned about pt performing vocal cord adduction exercises as they were told during inpatient stay that pt shouldn't "bear down." Chart review didn't reveal any restrictions but pt and his family are ocncerned. They also voice concern over restricted number of visits per insurance and are considering a great emphasis on PT to  increase pt's functional independence as pt and his wife report that most people understand pt.   ? ?  ?  ? ?  ? ? ? SLP Education - 10/06/21 2142   ? ? Education Details aspiration precaution, will follow up with attending from previous admission   ? Person(s) Educated Patient;Spouse   ? Methods Explanation;Demonstration;Verbal cues;Handout   ? Comprehension Verbalized understanding   ? ?  ?  ? ?  ? ? ? SLP Short Term Goals - 10/02/21 0824   ? ?  ? SLP SHORT TERM GOAL #1  ? Title The patient will maximize voice quality and loudness using breath support for sustained vowel production, pitch glides, and hierarchal speech drill   ? Baseline severe impairments ~ 1 word per breath   ? Time 10   ? Period --   sessions  ? Status New   ?  ? SLP SHORT TERM GOAL #2  ? Title The patient will eliminate phonotraumatic behaviors such as chronic throat clearing, by substituting non-traumatic methods to clear mucus.   ? Baseline moderate severity   ? Time 10   ? Period --   sessions  ? Status New   ? ?  ?  ? ?  ? ? ? SLP Long Term Goals - 10/02/21 0825   ? ?  ? SLP LONG TERM  GOAL #1  ? Title Pt will improve vocal quality, vocal hygiene, voice projection, and prevent vocal fatigue.   ? Baseline moderate to severe dysphonia resulting in < 50% speech intelligibility   ? Time 12   ? Period Weeks   ? Status New   ? Target Date 12/25/21   ? ?  ?  ? ?  ? ? ? Plan - 10/06/21 2143   ? ? Clinical Impression Statement Pt made to follow up with pt and his wife for further direction of POC related to voice therapy.   ? Speech Therapy Frequency --   pending pt and family decision  ? Treatment/Interventions SLP instruction and feedback;Patient/family education   ? Potential to Achieve Goals Good   ? Consulted and Agree with Plan of Care Patient;Family member/caregiver   ? Family Member Consulted pt's wife   ? ?  ?  ? ?  ? ? ?Patient will benefit from skilled therapeutic intervention in order to improve the following deficits and impairments:    ?Dysphonia ? ?Vocal cord dysfunction ? ? ? ?Problem List ?There are no problems to display for this patient. ? ?Euclide Granito B. Dreama Saa, M.S., CCC-SLP, CBIS ?Speech-Language Pathologist ?Rehabilitation Services ?Office [phone] ? ?Loden Laurent, CCC-SLP ?10/06/2021, 9:45 PM ? ?[place] ?[facility] ?[address] ?[place], [place], [address] ?Phone: [phone]   Fax:  [phone] ? ? ?Name: Dennis Mcintosh ?MRN: [id number] ?Date of Birth: January 19, 1966 ? ?

## 2021-10-09 ENCOUNTER — Ambulatory Visit: Payer: Medicaid Other | Admitting: Speech Pathology

## 2021-10-09 ENCOUNTER — Other Ambulatory Visit: Payer: Self-pay

## 2021-10-09 ENCOUNTER — Ambulatory Visit: Payer: Medicaid Other

## 2021-10-09 DIAGNOSIS — R269 Unspecified abnormalities of gait and mobility: Secondary | ICD-10-CM | POA: Diagnosis not present

## 2021-10-09 DIAGNOSIS — M6281 Muscle weakness (generalized): Secondary | ICD-10-CM

## 2021-10-09 DIAGNOSIS — R262 Difficulty in walking, not elsewhere classified: Secondary | ICD-10-CM

## 2021-10-09 DIAGNOSIS — R2681 Unsteadiness on feet: Secondary | ICD-10-CM

## 2021-10-09 DIAGNOSIS — R490 Dysphonia: Secondary | ICD-10-CM

## 2021-10-09 NOTE — Therapy (Signed)
[place] ?[facility] ?[place], [place], [address] ?Phone: [phone]   Fax:  [phone] ? ?Physical Therapy Treatment ? ?Patient Details  ?Name: Dennis Mcintosh ?MRN: [id number] ?Date of Birth: Sep 02, 1965 ?Referring Provider (PT): Dr. Ophelia Shoulder ? ? ?Encounter Date: 10/09/2021 ? ? PT End of Session - 10/09/21 1433   ? ? Visit Number 22   ? Number of Visits 29   ? Date for PT Re-Evaluation 11/13/21   ? Authorization Time Period Initial Certification [id number]; Recert [id number]; Recert [id number]   ? Progress Note Due on Visit 30   ? PT Start Time [id number]   ? PT Stop Time 1515   ? PT Time Calculation (min) 42 min   ? Equipment Utilized During Treatment Gait belt   ? Activity Tolerance Patient tolerated treatment well   ? Behavior During Therapy [facility] for tasks assessed/performed   ? ?  ?  ? ?  ? ? ?Past Medical History:  ?Diagnosis Date  ? Diabetes mellitus without complication ([facility])   ? ? ?Past Surgical History:  ?Procedure Laterality Date  ? HEMORRHOID SURGERY    ? ? ?There were no vitals filed for this visit. ? ? Subjective Assessment - 10/09/21 1547   ? ? Subjective Patient reports continuing to work on walking with cane at home. WIfe reports they do not feel secure enough with his balance for him to walk in from parking lot due to some knee buckling at times.   ? Patient is accompained by: Family member;Interpreter   ? 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 S/p removal of trach/G tube on 5/27. He most recently presented to ED on 02/26/2021 due to nausea and vomiting. He was at [facility] from 01/31/2021- 02/23/2021.   ? Limitations Lifting;Standing;Walking;House hold activities   ? How long can you sit comfortably? no issues   ? How long can you stand comfortably? < 5 min   ? How long can you walk comfortably? < 5 min   ?  Patient Stated Goals I want to go back to work   ? Currently in Pain? No/denies   ? ?  ?  ? ?  ? ?*Interpreter: Lloyda ? ?INTERVENTIONS:  ? ?Neuromuscular re-ed:  ? ?Dynamic Marching in place on blue airex pad x 1 min ? ?Side step  up onto blue airex pad and then down to right and back across x 12 reps.  ? ? ?tandem standing on 1/2 foam - Multiple attempts - best hold x 20 sec with right leg behind and 11 sec with left leg behind ? ?Squat to cone- Without UE Support x 10 and with use of holding onto cane with right UE x 10 reps- VC on squat form.  ? ?Golfers lean to cone with use of cane x 10 reps.  ? ? ?Gait forward/backward with use of cane x 30 feet and back x 4 trips ? ? ?Therex:  ? ?Staggered sit to stand x 10 reps each leg ? ? ?Education provided throughout session via VC/TC and demonstration to facilitate movement at target joints and correct muscle activation for all testing and exercises performed.  ? ? ? ? ? ? ? ? ? ? PT Education - 10/09/21 1546   ? ? Education Details Exercise technique   ? Person(s) Educated Patient   ? Methods Explanation;Demonstration;Tactile cues;Verbal cues   ? Comprehension Verbalized understanding;Returned demonstration;Verbal cues  required;Tactile cues required;Need further instruction   ? ?  ?  ? ?  ? ? ? PT Short Term Goals - 05/08/21 1017   ? ?  ? PT SHORT TERM GOAL #1  ? Title Pt will be independent with HEP in order to improve strength and balance in order to decrease fall risk and improve function at home and work.   ? Baseline 03/02/2021- Patient with no formal HEP in place; 05/08/2021- Patient able to verbalize good understanding of current HEP involving strengthening and mobility including ambulation with cane.   ? Time 6   ? Period Weeks   ? Status Achieved   ? Target Date 04/13/21   ?  ? PT SHORT TERM GOAL #2  ? Title Patient will demonstrate safe and modified independent sit to stand transfers for improved independence in the home and decreased caregiver assist.   ?  Baseline 03/02/2021- Paitent requires min assist for safe transfers in clinic today; 05/08/2021= Patient able to demonstrate modified ind with all sit to stand transfers using BUE and a quad cane without difficulty.   ? Time 6   ? Period Weeks   ? Status Achieved   ? Target Date 04/13/21   ? ?  ?  ? ?  ? ? ? ? PT Long Term Goals - 09/26/21 1501   ? ?  ? PT LONG TERM GOAL #1  ? Title Pt will improve FOTO to target score of 73 to display perceived improvements in ability to complete ADL's.   ? Baseline 03/02/2021= 58%; 05/29/2021= 52%; 08/21/2021= 57%   ? Time 12   ? Period Weeks   ? Status On-going   ? Target Date 11/13/21   ?  ? PT LONG TERM GOAL #2  ? Title Pt will improve BERG by at least 13 points in order to demonstrate clinically significant improvement in balance.   ? Baseline 03/02/2021= 8/56; 05/29/2021= 37/56   ? Time 12   ? Period Weeks   ? Status Achieved   ?  ? PT LONG TERM GOAL #3  ? Title Pt will decrease 5TSTS by at least 10 seconds in order to demonstrate clinically significant improvement in LE strength.   ? Baseline 03/02/2021=36.11 sec with BUE Support; 05/29/2021= 17.87 sec without UE support;   ? Time 12   ? Period Weeks   ? Status Achieved   ?  ? PT LONG TERM GOAL #4  ? Title Pt will decrease TUG to below 18 seconds/decrease in order to demonstrate decreased fall risk.   ? Baseline 03/02/2021= 45.14 sec using RW; 05/29/2021= 24.68 sec with SBQC; 08/21/2021=17.18 sec   ? Time 12   ? Period Weeks   ? Status Achieved   ? Target Date 08/21/21   ?  ? PT LONG TERM GOAL #5  ? Title Patient will be modified independent in walking on even/uneven surface with least restrictive assistive device, for 20+ minutes without rest break, reporting some difficulty or less to improve walking tolerance with community ambulation including grocery shopping, going to church,etc.   ? Baseline 03/02/2021= Patient limited to < 100 feet using RW: Patient currently walking around 300 feet with use of SBQC in clinic during sessions- not  directly assessed today- will reassess next session. 08/21/2021- Patient ambulated in clinic using [facility] with close SBA for 750 feet.   ? Time 12   ? Period Weeks   ? Status On-going   ? Target Date 11/13/21   ?  ?  PT LONG TERM GOAL #6  ? Title Pt will improve BERG by at least 7 points in order to demonstrate clinically significant improvement in balance.   ? Baseline 05/29/2021= 37/56; 08/21/2021= 43/56; 09/26/2021= 47/56   ? Time 12   ? Period Weeks   ? Status On-going   ? Target Date 11/13/21   ?  ? PT LONG TERM GOAL #7  ? Title Pt will increase 10MWT by at least 0.13 m/s in order to demonstrate clinically significant improvement in community ambulation.   ? Baseline 05/29/2021= 0.41 m/s; 08/21/2021= 0.68 m/s with SBQC; 09/26/2021= 0.91 m/s using QC   ? Time 12   ? Period Weeks   ? Status Achieved   ? Target Date 08/21/21   ?  ? PT LONG TERM GOAL #8  ? Title Pt will decrease 5TSTS by at least 3 seconds in order to demonstrate clinically significant improvement in LE strength.   ? Baseline 08/21/2021=15.18 sec; 09/26/2021= 13.88 sec without an AD   ? Time 12   ? Period Weeks   ? Status On-going   ? Target Date 11/13/21   ?  ? PT LONG TERM GOAL  #9  ? TITLE Pt will decrease TUG to below 14 seconds/decrease in order to demonstrate decreased fall risk.   ? Baseline 08/21/2021= 17.80 sec with SBQC; 09/26/2021= 15.01 sec   ? Time 12   ? Period Weeks   ? Status New   ? Target Date 11/13/21   ?  ? PT LONG TERM GOAL  #10  ? TITLE Pt will increase 6MWT by at least 18m (157ft) in order to demonstrate clinically significant improvement in cardiopulmonary endurance and community ambulation   ? Baseline 08/21/2021= 750 feet with SBQC; 3/1/203= 960 feet with use of SBQC   ? Time 12   ? Period Weeks   ? Status On-going   ? Target Date 11/13/21   ? ?  ?  ? ?  ? ? ? ? ? ? ? ? Plan - 10/09/21 1539   ? ? Clinical Impression Statement Patient arrived with good motivation today and participated well with all interventions. He does require increased VC  and visual demo for correct technique.He was able to progress well overall with practice with all activities and able to demo good technique with reaching down to floor using squat or golfers lean technique. Pati

## 2021-10-09 NOTE — Therapy (Signed)
[place] ?[facility] ?[address] ?[place], [place], [address] ?Phone: [phone]   Fax:  [phone] ? ?Patient Details  ?Name: Dennis Mcintosh ?MRN: [id number] ?Date of Birth: 01-08-1966 ?Referring Provider:  Merrily Brittle,* ? ?Encounter Date: 10/09/2021 ? ?Pt seen at end of PT while interpreter was present. Pt and his wife voice desire to use pt's limited number of visits for PT to improve pt's balance and overall functional mobility/independence.  ? ?At this time, we will cancel future ST appts.  ? ?Dennis Mcintosh, M.S., CCC-SLP, CBIS ?Speech-Language Pathologist ?Rehabilitation Services ?Office [phone] ? ?Dennis Mcintosh [name], CCC-SLP ?10/09/2021, 3:48 PM ? ?[place] ?[facility] ?[address] ?[place], [place], [address] ?Phone: [phone]   Fax:  [phone] ?

## 2021-10-11 ENCOUNTER — Ambulatory Visit: Payer: Medicaid Other | Admitting: Speech Pathology

## 2021-10-12 ENCOUNTER — Ambulatory Visit: Payer: Medicaid Other

## 2021-10-16 ENCOUNTER — Other Ambulatory Visit: Payer: Self-pay

## 2021-10-16 ENCOUNTER — Ambulatory Visit: Payer: Medicaid Other

## 2021-10-16 DIAGNOSIS — M6281 Muscle weakness (generalized): Secondary | ICD-10-CM

## 2021-10-16 DIAGNOSIS — R269 Unspecified abnormalities of gait and mobility: Secondary | ICD-10-CM

## 2021-10-16 DIAGNOSIS — R2681 Unsteadiness on feet: Secondary | ICD-10-CM

## 2021-10-16 DIAGNOSIS — R262 Difficulty in walking, not elsewhere classified: Secondary | ICD-10-CM

## 2021-10-16 DIAGNOSIS — R278 Other lack of coordination: Secondary | ICD-10-CM

## 2021-10-16 NOTE — Therapy (Signed)
[place] ?[facility] ?[address] ?[place], [place], [address] ?Phone: [phone]   Fax:  [phone] ? ?Physical Therapy Treatment ? ?Patient Details  ?Name: Dennis Mcintosh ?MRN: [id number] ?Date of Birth: 12/08/1965 ?Referring Provider (PT): Dr. Windle Guard ? ? ?Encounter Date: 10/16/2021 ? ? PT End of Session - 10/16/21 1330   ? ? Visit Number 23   ? Number of Visits 29   ? Date for PT Re-Evaluation 11/13/21   ? Authorization Time Period Initial Certification 03/02/2021-05/25/2021; Recert -08/21/2021; Recert 08/21/2021-11/13/2021   ? Progress Note Due on Visit 30   ? PT Start Time 1151   ? PT Stop Time 1230   ? PT Time Calculation (min) 39 min   ? Equipment Utilized During Treatment Gait belt   ? Activity Tolerance Patient tolerated treatment well   ? Behavior During Therapy [facility] for tasks assessed/performed   ? ?  ?  ? ?  ? ? ?Past Medical History:  ?Diagnosis Date  ? Diabetes mellitus without complication ([facility])   ? ? ?Past Surgical History:  ?Procedure Laterality Date  ? HEMORRHOID SURGERY    ? ? ?There were no vitals filed for this visit. ? ? Subjective Assessment - 10/16/21 1153   ? ? Subjective Patient reports doing okay- no new complaints, No falls and no pain.   ? Patient is accompained by: Family member;Interpreter   ? 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 S/p removal of trach/G tube on 5/27. He most recently presented to ED on 02/26/2021 due to nausea and vomiting. He was at [facility] rehab from 01/31/2021- 02/23/2021.   ? Limitations Lifting;Standing;Walking;House hold activities   ? How long can you sit comfortably? no issues   ? How long can you stand comfortably? < 5 min   ? How long can you walk comfortably? < 5 min   ? Patient Stated Goals I want to go back to work   ? Currently in Pain? No/denies   ? ?  ?  ? ?  ? ? ? ? ? ?*Interpreter:  Mar[place] ? ?INTERVENTIONS ? ? ? ?KORE Balance activities: Follow the ball- Initially 3 games with UE support- good ability to move hips ant/post/lateral- and able to complete 9-12 in 30 sec each time. Patient then performed without UE support with much more difficulty coordinating movement and maintaining balance.  ? ? ? ?Star activity- 1 foot in middle of star- using opp LE to reach at tap each point of star on same side x 5 trials each LE- Intermittent LOB and difficulty maintaining balance.  ? ?Tandem- Forward/Retro gait x 8 feet and back x 10 trials- Initially very unsteady but improved with increased practice. VC to slow down and patient ended up performing on last 2 trials without LOB.  ? ?Education provided throughout session via VC/TC and demonstration to facilitate movement at target joints and correct muscle activation for all testing and exercises performed.  ? ?. ? ? ? ? ? ? ? ? ? ? ? ? ? ? ? PT Education - 10/16/21 1331   ? ? Education Details Balance technique using KORE balance.   ? Person(s) Educated Patient   ? Methods Explanation;Demonstration;Tactile cues;Verbal cues   ? Comprehension Verbalized understanding;Returned demonstration;Verbal cues required;Tactile cues required;Need further instruction   ? ?  ?  ? ?  ? ? ? PT Short Term Goals - 05/08/21 1017   ? ?  ?  PT SHORT TERM GOAL #1  ? Title Pt will be independent with HEP in order to improve strength and balance in order to decrease fall risk and improve function at home and work.   ? Baseline 03/02/2021- Patient with no formal HEP in place; 05/08/2021- Patient able to verbalize good understanding of current HEP involving strengthening and mobility including ambulation with cane.   ? Time 6   ? Period Weeks   ? Status Achieved   ? Target Date 04/13/21   ?  ? PT SHORT TERM GOAL #2  ? Title Patient will demonstrate safe and modified independent sit to stand transfers for improved independence in the home and decreased caregiver assist.   ? Baseline  03/02/2021- Paitent requires min assist for safe transfers in clinic today; 05/08/2021= Patient able to demonstrate modified ind with all sit to stand transfers using BUE and a quad cane without difficulty.   ? Time 6   ? Period Weeks   ? Status Achieved   ? Target Date 04/13/21   ? ?  ?  ? ?  ? ? ? ? PT Long Term Goals - 09/26/21 1501   ? ?  ? PT LONG TERM GOAL #1  ? Title Pt will improve FOTO to target score of 73 to display perceived improvements in ability to complete ADL's.   ? Baseline 03/02/2021= 58%; 05/29/2021= 52%; 08/21/2021= 57%   ? Time 12   ? Period Weeks   ? Status On-going   ? Target Date 11/13/21   ?  ? PT LONG TERM GOAL #2  ? Title Pt will improve BERG by at least 13 points in order to demonstrate clinically significant improvement in balance.   ? Baseline 03/02/2021= 8/56; 05/29/2021= 37/56   ? Time 12   ? Period Weeks   ? Status Achieved   ?  ? PT LONG TERM GOAL #3  ? Title Pt will decrease 5TSTS by at least 10 seconds in order to demonstrate clinically significant improvement in LE strength.   ? Baseline 03/02/2021=36.11 sec with BUE Support; 05/29/2021= 17.87 sec without UE support;   ? Time 12   ? Period Weeks   ? Status Achieved   ?  ? PT LONG TERM GOAL #4  ? Title Pt will decrease TUG to below 18 seconds/decrease in order to demonstrate decreased fall risk.   ? Baseline 03/02/2021= 45.14 sec using RW; 05/29/2021= 24.68 sec with SBQC; 08/21/2021=17.18 sec   ? Time 12   ? Period Weeks   ? Status Achieved   ? Target Date 08/21/21   ?  ? PT LONG TERM GOAL #5  ? Title Patient will be modified independent in walking on even/uneven surface with least restrictive assistive device, for 20+ minutes without rest break, reporting some difficulty or less to improve walking tolerance with community ambulation including grocery shopping, going to church,etc.   ? Baseline 03/02/2021= Patient limited to < 100 feet using RW: Patient currently walking around 300 feet with use of SBQC in clinic during sessions- not directly  assessed today- will reassess next session. 08/21/2021- [facility] with close SBA for 750 feet.   ? Time 12   ? Period Weeks   ? Status On-going   ? Target Date 11/13/21   ?  ? PT LONG TERM GOAL #6  ? Title Pt will improve BERG by at least 7 points in order to demonstrate clinically significant improvement in balance.   ? Baseline 05/29/2021=  37/56; 08/21/2021= 43/56; 09/26/2021= 47/56   ? Time 12   ? Period Weeks   ? Status On-going   ? Target Date 11/13/21   ?  ? PT LONG TERM GOAL #7  ? Title Pt will increase by at least 0.13 m/s in order to demonstrate clinically significant improvement in community ambulation.   ? Baseline 05/29/2021= 0.41 m/s; 08/21/2021= 0.68 m/s with SBQC; 09/26/2021= 0.91 m/s using QC   ? Time 12   ? Period Weeks   ? Status Achieved   ? Target Date 08/21/21   ?  ? PT LONG TERM GOAL #8  ? Title Pt will decrease 5TSTS by at least 3 seconds in order to demonstrate clinically significant improvement in LE strength.   ? Baseline 08/21/2021=15.18 sec; 09/26/2021= 13.88 sec without an AD   ? Time 12   ? Period Weeks   ? Status On-going   ? Target Date 11/13/21   ?  ? PT LONG TERM GOAL  #9  ? TITLE Pt will decrease TUG to below 14 seconds/decrease in order to demonstrate decreased fall risk.   ? Baseline 08/21/2021= 17.80 sec with SBQC; 09/26/2021= 15.01 sec   ? Time 12   ? Period Weeks   ? Status New   ? Target Date 11/13/21   ?  ? PT LONG TERM GOAL  #10  ? TITLE Pt will increase by at least 74m (142ft) in order to demonstrate clinically significant improvement in cardiopulmonary endurance and community ambulation   ? Baseline 08/21/2021= 750 feet with SBQC; 3/1/203= 960 feet with use of SBQC   ? Time 12   ? Period Weeks   ? Status On-going   ? Target Date 11/13/21   ? ?  ?  ? ?  ? ? ? ? ? ? ? ? Plan - 10/16/21 1329   ? ? Clinical Impression Statement Patient presents with good motivation today. He was able to demonstrate progress overall throughout session- adapting to tasks  and through practice able to demonstrate improvement with less unsteadiness. Patient will continue to benefit from skilled PT services to continue to progress his LE strength and all functional mobility for improved ho

## 2021-10-18 ENCOUNTER — Encounter: Payer: Medicaid Other | Admitting: Speech Pathology

## 2021-10-18 ENCOUNTER — Ambulatory Visit: Payer: Medicaid Other

## 2021-10-23 ENCOUNTER — Other Ambulatory Visit: Payer: Self-pay

## 2021-10-23 ENCOUNTER — Ambulatory Visit: Payer: Medicaid Other

## 2021-10-23 DIAGNOSIS — R269 Unspecified abnormalities of gait and mobility: Secondary | ICD-10-CM

## 2021-10-23 DIAGNOSIS — R262 Difficulty in walking, not elsewhere classified: Secondary | ICD-10-CM

## 2021-10-23 DIAGNOSIS — R278 Other lack of coordination: Secondary | ICD-10-CM

## 2021-10-23 DIAGNOSIS — M6281 Muscle weakness (generalized): Secondary | ICD-10-CM

## 2021-10-23 DIAGNOSIS — R2681 Unsteadiness on feet: Secondary | ICD-10-CM

## 2021-10-23 NOTE — Therapy (Signed)
[place] ?[facility] ?[address] ?[place], [place], [address] ?Phone: [phone]   Fax:  [phone] ? ?Physical Therapy Treatment ? ?Patient Details  ?Name: Dennis Mcintosh ?MRN: [id number] ?Date of Birth: [birth year] ?Referring Provider (PT): Dr. Windle Guard ? ? ?Encounter Date: 10/23/2021 ? ? PT End of Session - 10/23/21 1301   ? ? Visit Number 24   ? Number of Visits 29   ? Date for PT Re-Evaluation 11/13/21   ? Authorization Time Period Initial Certification 03/02/2021-05/25/2021; Recert -08/21/2021; Recert 08/21/2021-11/13/2021   ? Progress Note Due on Visit 30   ? PT Start Time 1300   ? PT Stop Time 1345   ? PT Time Calculation (min) 45 min   ? Equipment Utilized During Treatment Gait belt   ? Activity Tolerance Patient tolerated treatment well   ? Behavior During Therapy [facility] for tasks assessed/performed   ? ?  ?  ? ?  ? ? ?Past Medical History:  ?Diagnosis Date  ? Diabetes mellitus without complication (HCC)   ? ? ?Past Surgical History:  ?Procedure Laterality Date  ? HEMORRHOID SURGERY    ? ? ?There were no vitals filed for this visit. ? ? Subjective Assessment - 10/23/21 1301   ? ? Subjective Patient reports he feels he is walking better and practicing with cane.   ? Patient is accompained by: Family member;Interpreter   ? 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 S/p removal of trach/G tube on 5/27. He most recently presented to ED on 02/26/2021 due to nausea and vomiting. He was at [facility] rehab from 01/31/2021- 02/23/2021.   ? Limitations Lifting;Standing;Walking;House hold activities   ? How long can you sit comfortably? no issues   ? How long can you stand comfortably? < 5 min   ? How long can you walk comfortably? < 5 min   ? Patient Stated Goals I want to go back to work   ? Currently in Pain? No/denies   ? ?  ?  ? ?  ? ? ? ?INTERVENTIONS:   ? ?INTERPRETER: Kandis Cocking ? ? ?Neuromuscular re-ed ? ?Standing in center with airex pad to left and green therapad to right- Side step onto left then center then right x 12 reps each direction.  ? ?Mini lunge squat at support bar- x 12 reps each LE- increased unsteadiness - improved with reps.  ? ?Dynamic high knee marching standing on airex pad without UE support x 20 reps- intermittent reaching for Support bar ? ?Dynamic high knee marching standing on firm surface x 20 reps each- able to complete without UE support ? ? ? ?Dynamic gait in hallway- 80 feet x 1 trial each of the following: ?- Walking straight ahead with use of SPC, CGA  ?- horizontal head turn- Left to right- mild deviation yet no LOB ?-Vertical head turns- Increased unsteadiness yet no loss of balance ? ? ?- Dynamic kicking soccer ball against wall - moving lateral left to right and kicking ball with either foot (whichever side ball traveled to) x 8 min with 1 rest.  ? ?Education provided throughout session via VC/TC and demonstration to facilitate movement at target joints and correct muscle activation for all testing and exercises performed.  ? ? ? ? ? ? ? ? ? ? ? ? ? ? ? ? ? ? ? ? PT Education - 10/23/21 1306   ? ? Education Details  Exercise technique   ? Person(s) Educated Patient   ? Methods Explanation;Demonstration;Tactile cues;Verbal cues   ? Comprehension Verbalized understanding;Returned demonstration;Verbal cues required;Tactile cues required;Need further instruction   ? ?  ?  ? ?  ? ? ? PT Short Term Goals - 05/08/21 1017   ? ?  ? PT SHORT TERM GOAL #1  ? Title Pt will be independent with HEP in order to improve strength and balance in order to decrease fall risk and improve function at home and work.   ? Baseline 03/02/2021- Patient with no formal HEP in place; 05/08/2021- Patient able to verbalize good understanding of current HEP involving strengthening and mobility including ambulation with cane.   ? Time 6   ? Period Weeks   ? Status  Achieved   ? Target Date 04/13/21   ?  ? PT SHORT TERM GOAL #2  ? Title Patient will demonstrate safe and modified independent sit to stand transfers for improved independence in the home and decreased caregiver assist.   ? Baseline 03/02/2021- Paitent requires min assist for safe transfers in clinic today; 05/08/2021= Patient able to demonstrate modified ind with all sit to stand transfers using BUE and a quad cane without difficulty.   ? Time 6   ? Period Weeks   ? Status Achieved   ? Target Date 04/13/21   ? ?  ?  ? ?  ? ? ? ? PT Long Term Goals - 09/26/21 1501   ? ?  ? PT LONG TERM GOAL #1  ? Title Pt will improve FOTO to target score of 73 to display perceived improvements in ability to complete ADL's.   ? Baseline 03/02/2021= 58%; 05/29/2021= 52%; 08/21/2021= 57%   ? Time 12   ? Period Weeks   ? Status On-going   ? Target Date 11/13/21   ?  ? PT LONG TERM GOAL #2  ? Title Pt will improve BERG by at least 13 points in order to demonstrate clinically significant improvement in balance.   ? Baseline 03/02/2021= 8/56; 05/29/2021= 37/56   ? Time 12   ? Period Weeks   ? Status Achieved   ?  ? PT LONG TERM GOAL #3  ? Title Pt will decrease 5TSTS by at least 10 seconds in order to demonstrate clinically significant improvement in LE strength.   ? Baseline 03/02/2021=36.11 sec with BUE Support; 05/29/2021= 17.87 sec without UE support;   ? Time 12   ? Period Weeks   ? Status Achieved   ?  ? PT LONG TERM GOAL #4  ? Title Pt will decrease TUG to below 18 seconds/decrease in order to demonstrate decreased fall risk.   ? Baseline 03/02/2021= 45.14 sec using RW; 05/29/2021= 24.68 sec with SBQC; 08/21/2021=17.18 sec   ? Time 12   ? Period Weeks   ? Status Achieved   ? Target Date 08/21/21   ?  ? PT LONG TERM GOAL #5  ? Title Patient will be modified independent in walking on even/uneven surface with least restrictive assistive device, for 20+ minutes without rest break, reporting some difficulty or less to improve walking tolerance with  community ambulation including grocery shopping, going to church,etc.   ? Baseline 03/02/2021= Patient limited to < 100 feet using RW: Patient currently walking around 300 feet with use of SBQC in clinic during sessions- not directly assessed today- will reassess next session. 08/21/2021- Patient ambulated in clinic using [facility] with close SBA for 750 feet.   ?  Time 12   ? Period Weeks   ? Status On-going   ? Target Date 11/13/21   ?  ? PT LONG TERM GOAL #6  ? Title Pt will improve BERG by at least 7 points in order to demonstrate clinically significant improvement in balance.   ? Baseline 05/29/2021= 37/56; 08/21/2021= 43/56; 09/26/2021= 47/56   ? Time 12   ? Period Weeks   ? Status On-going   ? Target Date 11/13/21   ?  ? PT LONG TERM GOAL #7  ? Title Pt will increase by at least 0.13 m/s in order to demonstrate clinically significant improvement in community ambulation.   ? Baseline 05/29/2021= 0.41 m/s; 08/21/2021= 0.68 m/s with SBQC; 09/26/2021= 0.91 m/s using QC   ? Time 12   ? Period Weeks   ? Status Achieved   ? Target Date 08/21/21   ?  ? PT LONG TERM GOAL #8  ? Title Pt will decrease 5TSTS by at least 3 seconds in order to demonstrate clinically significant improvement in LE strength.   ? Baseline 08/21/2021=15.18 sec; 09/26/2021= 13.88 sec without an AD   ? Time 12   ? Period Weeks   ? Status On-going   ? Target Date 11/13/21   ?  ? PT LONG TERM GOAL  #9  ? TITLE Pt will decrease TUG to below 14 seconds/decrease in order to demonstrate decreased fall risk.   ? Baseline 08/21/2021= 17.80 sec with SBQC; 09/26/2021= 15.01 sec   ? Time 12   ? Period Weeks   ? Status New   ? Target Date 11/13/21   ?  ? PT LONG TERM GOAL  #10  ? TITLE Pt will increase by at least 75m (150ft) in order to demonstrate clinically significant improvement in cardiopulmonary endurance and community ambulation   ? Baseline 08/21/2021= 750 feet with SBQC; 3/1/203= 960 feet with use of SBQC   ? Time 12   ? Period Weeks   ? Status On-going   ?  Target Date 11/13/21   ? ?  ?  ? ?  ? ? ? ? ? ? ? ? Plan - 10/23/21 1303   ? ? Clinical Impression Statement Patient presents with good motivation today and presents with improving overall balance with less unsteadiness wi

## 2021-10-25 ENCOUNTER — Ambulatory Visit: Payer: Medicaid Other

## 2021-10-30 ENCOUNTER — Ambulatory Visit: Payer: Medicaid Other | Attending: Physical Medicine and Rehabilitation

## 2021-10-30 ENCOUNTER — Encounter: Payer: Medicaid Other | Admitting: Speech Pathology

## 2021-10-30 DIAGNOSIS — R2681 Unsteadiness on feet: Secondary | ICD-10-CM | POA: Diagnosis present

## 2021-10-30 DIAGNOSIS — R269 Unspecified abnormalities of gait and mobility: Secondary | ICD-10-CM | POA: Diagnosis present

## 2021-10-30 DIAGNOSIS — R278 Other lack of coordination: Secondary | ICD-10-CM | POA: Diagnosis present

## 2021-10-30 DIAGNOSIS — M6281 Muscle weakness (generalized): Secondary | ICD-10-CM | POA: Diagnosis present

## 2021-10-30 DIAGNOSIS — R262 Difficulty in walking, not elsewhere classified: Secondary | ICD-10-CM | POA: Diagnosis present

## 2021-10-30 NOTE — Therapy (Signed)
[place] ?[facility] ?[address] ?[place], [place], [address] ?Phone: [phone]   Fax:  [phone] ? ?Physical Therapy Treatment ? ?Patient Details  ?Name: Dennis Mcintosh ?MRN: [id number] ?Date of Birth: 1966-01-17 ?Referring Provider (PT): Dr. Windle GuardKimberly Rauch ? ? ?Encounter Date: 10/30/2021 ? ? PT End of Session - 10/30/21 1305   ? ? Visit Number 25   ? Number of Visits 29   ? Date for PT Re-Evaluation 11/13/21   ? Authorization Time Period Initial Certification 03/02/2021-05/25/2021; Recert -08/21/2021; Recert 08/21/2021-11/13/2021   ? Progress Note Due on Visit 30   ? PT Start Time 1300   ? PT Stop Time 1342   ? PT Time Calculation (min) 42 min   ? Equipment Utilized During Treatment Gait belt   ? Activity Tolerance Patient tolerated treatment well   ? Behavior [facility] for tasks assessed/performed   ? ?  ?  ? ?  ? ? ?Past Medical History:  ?Diagnosis Date  ? Diabetes mellitus without complication (HCC)   ? ? ?Past Surgical History:  ?Procedure Laterality Date  ? HEMORRHOID SURGERY    ? ? ?There were no vitals filed for this visit. ? ? Subjective Assessment - 10/30/21 0828   ? ? Subjective Patient reports he has been compliant with practicing walking with his new single point cane and denies any falls.   ? Patient is accompained by: Family member;Interpreter   ? 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 S/p removal of trach/G tube on 5/27. He most recently presented to ED on 02/26/2021 due to nausea and vomiting. [facility]- 02/23/2021.   ? Limitations Lifting;Standing;Walking;House hold activities   ? How long can you sit comfortably? no issues   ? How long can you stand comfortably? < 5 min   ? How long can you walk comfortably? < 5 min   ? Patient Stated Goals I want to go back to work   ? Currently in Pain? No/denies   ? ?  ?   ? ?  ? ? ? ? ? ?INTERVENTION:  ? ? ? ? ?Gait indoor and outdoor:  ? ?Patient ambulated in hospital and outdoors on campus using single point cane on varying surfaces. He ambulated > 2000 feet across campus including concrete, asphalt, indoor hallway, Grass, uphill/downhill, pine needles, Up/down ramp around hospital. He had a couple of episodes of unsteadiness on downhill on unlevel terrain. Reviewed taking longer steps- scanning terrain and looking up. Min VC to avoid obstacles.  ? ? ? ? ? ? ? ? ? ? ? ? ? ? ? ? ? ? ? ? PT Education - 10/31/21 0829   ? ? Education Details Safety with outdoor mobility   ? Person(s) Educated Patient   ? Methods Explanation;Demonstration;Tactile cues;Verbal cues   ? Comprehension Verbalized understanding;Returned demonstration;Verbal cues required;Tactile cues required;Need further instruction   ? ?  ?  ? ?  ? ? ? PT Short Term Goals - 05/08/21 1017   ? ?  ? PT SHORT TERM GOAL #1  ? Title Pt will be independent with HEP in order to improve strength and balance in order to decrease fall risk and improve function at home and work.   ? Baseline 03/02/2021- Patient with no formal HEP in place; 05/08/2021- Patient able to verbalize good understanding of current HEP involving strengthening and mobility including ambulation  with cane.   ? Time 6   ? Period Weeks   ? Status Achieved   ? Target Date 04/13/21   ?  ? PT SHORT TERM GOAL #2  ? Title Patient will demonstrate safe and modified independent sit to stand transfers for improved independence in the home and decreased caregiver assist.   ? Baseline 03/02/2021- Paitent requires min assist for safe transfers in clinic today; 05/08/2021= Patient able to demonstrate modified ind with all sit to stand transfers using BUE and a quad cane without difficulty.   ? Time 6   ? Period Weeks   ? Status Achieved   ? Target Date 04/13/21   ? ?  ?  ? ?  ? ? ? ? PT Long Term Goals - 09/26/21 1501   ? ?  ? PT LONG TERM GOAL #1  ? Title Pt will improve FOTO to  target score of 73 to display perceived improvements in ability to complete ADL's.   ? Baseline 03/02/2021= 58%; 05/29/2021= 52%; 08/21/2021= 57%   ? Time 12   ? Period Weeks   ? Status On-going   ? Target Date 11/13/21   ?  ? PT LONG TERM GOAL #2  ? Title Pt will improve BERG by at least 13 points in order to demonstrate clinically significant improvement in balance.   ? Baseline 03/02/2021= 8/56; 05/29/2021= 37/56   ? Time 12   ? Period Weeks   ? Status Achieved   ?  ? PT LONG TERM GOAL #3  ? Title Pt will decrease 5TSTS by at least 10 seconds in order to demonstrate clinically significant improvement in LE strength.   ? Baseline 03/02/2021=36.11 sec with BUE Support; 05/29/2021= 17.87 sec without UE support;   ? Time 12   ? Period Weeks   ? Status Achieved   ?  ? PT LONG TERM GOAL #4  ? Title Pt will decrease TUG to below 18 seconds/decrease in order to demonstrate decreased fall risk.   ? Baseline 03/02/2021= 45.14 sec using RW; 05/29/2021= 24.68 sec with SBQC; 08/21/2021=17.18 sec   ? Time 12   ? Period Weeks   ? Status Achieved   ? Target Date 08/21/21   ?  ? PT LONG TERM GOAL #5  ? Title Patient will be modified independent in walking on even/uneven surface with least restrictive assistive device, for 20+ minutes without rest break, reporting some difficulty or less to improve walking tolerance with community ambulation including grocery shopping, going to church,etc.   ? Baseline 03/02/2021= Patient limited to < 100 feet using RW: Patient currently walking around 300 feet with use of SBQC in clinic during sessions- not directly assessed today- will reassess next session. 08/21/2021- Patient ambulated in clinic using [facility] with close SBA for 750 feet.   ? Time 12   ? Period Weeks   ? Status On-going   ? Target Date 11/13/21   ?  ? PT LONG TERM GOAL #6  ? Title Pt will improve BERG by at least 7 points in order to demonstrate clinically significant improvement in balance.   ? Baseline 05/29/2021= 37/56; 08/21/2021= 43/56;  09/26/2021= 47/56   ? Time 12   ? Period Weeks   ? Status On-going   ? Target Date 11/13/21   ?  ? PT LONG TERM GOAL #7  ? Title Pt will increase by at least 0.13 m/s in order to demonstrate clinically significant improvement in community ambulation.   ? Baseline  05/29/2021= 0.41 m/s; 08/21/2021= 0.68 m/s with SBQC; 09/26/2021= 0.91 m/s using QC   ? Time 12   ? Period Weeks   ? Status Achieved   ? Target Date 08/21/21   ?  ? PT LONG TERM GOAL #8  ? Title Pt will decrease 5TSTS by at least 3 seconds in order to demonstrate clinically significant improvement in LE strength.   ? Baseline 08/21/2021=15.18 sec; 09/26/2021= 13.88 sec without an AD   ? Time 12   ? Period Weeks   ? Status On-going   ? Target Date 11/13/21   ?  ? PT LONG TERM GOAL  #9  ? TITLE Pt will decrease TUG to below 14 seconds/decrease in order to demonstrate decreased fall risk.   ? Baseline 08/21/2021= 17.80 sec with SBQC; 09/26/2021= 15.01 sec   ? Time 12   ? Period Weeks   ? Status New   ? Target Date 11/13/21   ?  ? PT LONG TERM GOAL  #10  ? TITLE Pt will increase by at least 32m (147ft) in order to demonstrate clinically significant improvement in cardiopulmonary endurance and community ambulation   ? Baseline 08/21/2021= 750 feet with SBQC; 3/1/203= 960 feet with use of SBQC   ? Time 12   ? Period Weeks   ? Status On-going   ? Target Date 11/13/21   ? ?  ?  ? ?  ? ? ? ? ? ? ? ? Plan - 10/30/21 1306   ? ? Clinical Impression Statement Patient presented with good motivation for gait training today. Overall he demonstrated more steadiness than in past sessions. He was able to exhibit mostly short reciprocal steps using cane and good ability to maintain his balance on all terrain today. Patient will continue to benefit from skilled PT services to continue to progress his LE strength and all functional mobility for improved household and community distances and improved overall quality of life   ? Personal Factors and Comorbidities Comorbidity 1   ?  Comorbidities diabetes   ? Examination-Activity Limitations Bathing;Bed Mobility;Bend;Caring for Others;Carry;Dressing;Lift;Locomotion Level;Squat;Stairs;Stand;Transfers   ? Examination-Participation Restricti

## 2021-11-01 ENCOUNTER — Ambulatory Visit: Payer: Medicaid Other

## 2021-11-05 ENCOUNTER — Encounter: Payer: Medicaid Other | Admitting: Speech Pathology

## 2021-11-05 ENCOUNTER — Ambulatory Visit: Payer: Medicaid Other

## 2021-11-05 DIAGNOSIS — R2681 Unsteadiness on feet: Secondary | ICD-10-CM

## 2021-11-05 DIAGNOSIS — R269 Unspecified abnormalities of gait and mobility: Secondary | ICD-10-CM | POA: Diagnosis not present

## 2021-11-05 DIAGNOSIS — M6281 Muscle weakness (generalized): Secondary | ICD-10-CM

## 2021-11-05 DIAGNOSIS — R262 Difficulty in walking, not elsewhere classified: Secondary | ICD-10-CM

## 2021-11-05 DIAGNOSIS — R278 Other lack of coordination: Secondary | ICD-10-CM

## 2021-11-05 NOTE — Therapy (Signed)
?[facility] ?[address] ?[place], [place], [address] ?Phone: [phone]   Fax:  [phone] ? ?Physical Therapy Treatment ? ?Patient Details  ?Name: Dennis Mcintosh ?MRN: [id number] ?Date of Birth: May 10, 1966 ?Referring Provider (PT): Dr. Windle Guard ? ? ?Encounter Date: 11/05/2021 ? ? PT End of Session - 11/05/21 1640   ? ? Visit Number 26   ? Number of Visits 29   ? Date for PT Re-Evaluation 11/13/21   ? Authorization Time Period Initial Certification 03/02/2021-05/25/2021; Recert -08/21/2021; Recert 08/21/2021-11/13/2021   ? Progress Note Due on Visit 30   ? PT Start Time 1304   ? PT Stop Time 1344   ? PT Time Calculation (min) 40 min   ? Equipment Utilized During Treatment Gait belt   ? Activity Tolerance Patient tolerated treatment well   ? Behavior During Therapy [facility] for tasks assessed/performed   ? ?  ?  ? ?  ? ? ?Past Medical History:  ?Diagnosis Date  ? Diabetes mellitus without complication (HCC)   ? ? ?Past Surgical History:  ?Procedure Laterality Date  ? HEMORRHOID SURGERY    ? ? ?There were no vitals filed for this visit. ? ? Subjective Assessment - 11/05/21 1639   ? ? Subjective Patient reports doing well today without any issues.   ? Patient is accompained by: Family member;Interpreter   ? 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 S/p removal of trach/G tube on 5/27. He most recently presented to ED on 02/26/2021 due to nausea and vomiting. He was at [facility] rehab from 01/31/2021- 02/23/2021.   ? Limitations Lifting;Standing;Walking;House hold activities   ? How long can you sit comfortably? no issues   ? How long can you stand comfortably? < 5 min   ? How long can you walk comfortably? < 5 min   ? Patient Stated Goals I want to go back to work   ? Currently in Pain? No/denies   ? ?  ?  ? ?  ? ?INTERVENTIONS: ? ?Gait training:  ?- Biodex - TM- at  1. with BUE support - for 2 min (no LOB) - min VC to keep feet moving.  ?- Biodex - TM at 1.2 mph with 1 UE support- for 2 min (minimal increase unsteadiness)  ?-Biodex - TM at 0.5 mph with No UE support- for 1 min- Stopped secondary to feet unable to keep up without UE support.  ? ?Transitioned to walking without a device in gym/hallway x 300 feet- CGA - patient would walk straight yet every 5- 7 steps experience a mis-step where 1 LE would cross midline throwing his balance off.  ? ?Neuromuscular re-ed:  ? ?Side steps on TM at 0.3-0.4 mph and BUE Support x 2 min each side- patient focusing on keep feet moving - Difficulty with sequencing and poor overall endurance with continuous motion.  ? ?Standing ball twist -trunk twist to left and right handing 2kg ball to PT - No Loss of balance in static position.  ?Progressed to same activity while walking forward and patient exhibited increased unsteadiness with several LOB- attempting to just use his arm to reach for ball rather than turn his head. He denied any dizziness but very unsteady  walking and reaching today.  ? ?Mini lunge squat with quick change of feet focusing on coordination- x 10 reps each leg. Patient exhibited difficulty with sequencing without UE support.  ? ?  Cone -activities:  ?-4 corner - walking forward/backward/B side steps to 4 cones.  ?- Added diagonal walking and tapping onto cone at each corner.  ? ?Education provided throughout session via VC/TC and demonstration to facilitate movement at target joints and correct muscle activation for all testing and exercises performed.  ? ? ? ? ? ? ? ? ? ? ? ? ? ? ? ? ? ? ? ? ? ? PT Education - 11/05/21 1639   ? ? Education Details Exercise technique   ? Person(s) Educated Patient   ? Methods Explanation;Demonstration;Tactile cues;Verbal cues   ? Comprehension Verbalized understanding;Returned demonstration;Verbal cues required;Tactile cues required;Need further instruction   ? ?  ?  ? ?  ? ? ? PT Short  Term Goals - 05/08/21 1017   ? ?  ? PT SHORT TERM GOAL #1  ? Title Pt will be independent with HEP in order to improve strength and balance in order to decrease fall risk and improve function at home and work.   ? Baseline 03/02/2021- Patient with no formal HEP in place; 05/08/2021- Patient able to verbalize good understanding of current HEP involving strengthening and mobility including ambulation with cane.   ? Time 6   ? Period Weeks   ? Status Achieved   ? Target Date 04/13/21   ?  ? PT SHORT TERM GOAL #2  ? Title Patient will demonstrate safe and modified independent sit to stand transfers for improved independence in the home and decreased caregiver assist.   ? Baseline 03/02/2021- Paitent requires min assist for safe transfers in clinic today; 05/08/2021= Patient able to demonstrate modified ind with all sit to stand transfers using BUE and a quad cane without difficulty.   ? Time 6   ? Period Weeks   ? Status Achieved   ? Target Date 04/13/21   ? ?  ?  ? ?  ? ? ? ? PT Long Term Goals - 09/26/21 1501   ? ?  ? PT LONG TERM GOAL #1  ? Title Pt will improve FOTO to target score of 73 to display perceived improvements in ability to complete ADL's.   ? Baseline 03/02/2021= 58%; 05/29/2021= 52%; 08/21/2021= 57%   ? Time 12   ? Period Weeks   ? Status On-going   ? Target Date 11/13/21   ?  ? PT LONG TERM GOAL #2  ? Title Pt will improve BERG by at least 13 points in order to demonstrate clinically significant improvement in balance.   ? Baseline 03/02/2021= 8/56; 05/29/2021= 37/56   ? Time 12   ? Period Weeks   ? Status Achieved   ?  ? PT LONG TERM GOAL #3  ? Title Pt will decrease 5TSTS by at least 10 seconds in order to demonstrate clinically significant improvement in LE strength.   ? Baseline 03/02/2021=36.11 sec with BUE Support; 05/29/2021= 17.87 sec without UE support;   ? Time 12   ? Period Weeks   ? Status Achieved   ?  ? PT LONG TERM GOAL #4  ? Title Pt will decrease TUG to below 18 seconds/decrease in order to  demonstrate decreased fall risk.   ? Baseline 03/02/2021= 45.14 sec using RW; 05/29/2021= 24.68 sec with SBQC; 08/21/2021=17.18 sec   ? Time 12   ? Period Weeks   ? Status Achieved   ? Target Date 08/21/21   ?  ? PT LONG TERM GOAL #5  ? Title Patient will be modified independent  in walking on even/uneven surface with least restrictive assistive device, for 20+ minutes without rest break, reporting some difficulty or less to improve walking tolerance with community ambulation including grocery shopping, going to church,etc.   ? Baseline 03/02/2021= Patient limited to < 100 feet using RW: Patient currently walking around 300 feet with use of SBQC in clinic during sessions- not directly assessed today- will reassess next session. 08/21/2021- Patient ambulated in clinic using [facility] with close SBA for 750 feet.   ? Time 12   ? Period Weeks   ? Status On-going   ? Target Date 11/13/21   ?  ? PT LONG TERM GOAL #6  ? Title Pt will improve BERG by at least 7 points in order to demonstrate clinically significant improvement in balance.   ? Baseline 05/29/2021= 37/56; 08/21/2021= 43/56; 09/26/2021= 47/56   ? Time 12   ? Period Weeks   ? Status On-going   ? Target Date 11/13/21   ?  ? PT LONG TERM GOAL #7  ? Title Pt will increase by at least 0.13 m/s in order to demonstrate clinically significant improvement in community ambulation.   ? Baseline 05/29/2021= 0.41 m/s; 08/21/2021= 0.68 m/s with SBQC; 09/26/2021= 0.91 m/s using QC   ? Time 12   ? Period Weeks   ? Status Achieved   ? Target Date 08/21/21   ?  ? PT LONG TERM GOAL #8  ? Title Pt will decrease 5TSTS by at least 3 seconds in order to demonstrate clinically significant improvement in LE strength.   ? Baseline 08/21/2021=15.18 sec; 09/26/2021= 13.88 sec without an AD   ? Time 12   ? Period Weeks   ? Status On-going   ? Target Date 11/13/21   ?  ? PT LONG TERM GOAL  #9  ? TITLE Pt will decrease TUG to below 14 seconds/decrease in order to demonstrate decreased fall risk.   ? Baseline  08/21/2021= 17.80 sec with SBQC; 09/26/2021= 15.01 sec   ? Time 12   ? Period Weeks   ? Status New   ? Target Date 11/13/21   ?  ? PT LONG TERM GOAL  #10  ? TITLE Pt will increase by at least 51m (11ft) i

## 2021-11-07 ENCOUNTER — Ambulatory Visit: Payer: Medicaid Other

## 2021-11-12 ENCOUNTER — Encounter: Payer: Medicaid Other | Admitting: Speech Pathology

## 2021-11-12 ENCOUNTER — Ambulatory Visit: Payer: Medicaid Other

## 2021-11-12 DIAGNOSIS — R269 Unspecified abnormalities of gait and mobility: Secondary | ICD-10-CM

## 2021-11-12 DIAGNOSIS — M6281 Muscle weakness (generalized): Secondary | ICD-10-CM

## 2021-11-12 DIAGNOSIS — R2681 Unsteadiness on feet: Secondary | ICD-10-CM

## 2021-11-12 DIAGNOSIS — R278 Other lack of coordination: Secondary | ICD-10-CM

## 2021-11-12 DIAGNOSIS — R262 Difficulty in walking, not elsewhere classified: Secondary | ICD-10-CM

## 2021-11-12 NOTE — Therapy (Signed)
[facility] ?[place], [place], [address] ?Phone: [phone]   Fax:  [phone] ? ?Physical Therapy Treatment/Recertification for dates 11/12/2021-02/04/2022 ? ?Patient Details  ?Name: Dennis Mcintosh ?MRN: [id number] ?Date of Birth: 1966/06/09 ?Referring Provider (PT): Dr. Ophelia Shoulder ? ? ?Encounter Date: 11/12/2021 ? ? PT End of Session - 11/12/21 1309   ? ? Visit Number 27   ? Number of Visits 39   ? Date for PT Re-Evaluation 02/04/22   ? Authorization Time Period Initial Certification 02/29/1516-61/60/7371; Recert -0/62/6948; Recert 5/46/2703-5/00/9381   ? Progress Note Due on Visit 30   ? PT Start Time 1300   ? PT Stop Time 1344   ? PT Time Calculation (min) 44 min   ? Equipment Utilized During Treatment Gait belt   ? Activity Tolerance Patient tolerated treatment well   ? Behavior During Therapy [facility] for tasks assessed/performed   ? ?  ?  ? ?  ? ? ?Past Medical History:  ?Diagnosis Date  ? Diabetes mellitus without complication ([place])   ? ? ?Past Surgical History:  ?Procedure Laterality Date  ? HEMORRHOID SURGERY    ? ? ?There were no vitals filed for this visit. ? ? Subjective Assessment - 11/12/21 1307   ? ? Subjective Patient reports doing well today without any issues. States he continues to practice walking in home with cane- denies any falls.   ? Patient is accompained by: Family member;Interpreter   ? 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 S/p removal of trach/G tube on 5/27. He most recently presented to ED on 02/26/2021 due to nausea and vomiting. He was at [facility] rehab from 01/31/2021- 02/23/2021.   ? Limitations Lifting;Standing;Walking;House hold activities   ? How long can you sit comfortably? no issues   ? How long can you stand comfortably? < 5 min   ? How long can you walk comfortably? < 5 min   ? Patient Stated Goals I want  to go back to work   ? ?  ?  ? ?  ? ?INTERVENTIONS:  ? ?Assessed below goals for recert visit today: ? ? ? [facility] PT Assessment - 11/12/21 1327   ? ?  ? Berg Balance Test  ? Sit to Stand Able to stand without using hands and stabilize independently   ? Standing Unsupported Able to stand safely 2 minutes   ? Sitting with Back Unsupported but Feet Supported on Floor or Stool Able to sit safely and securely 2 minutes   ? Stand to Sit Sits safely with minimal use of hands   ? Transfers Able to transfer safely, minor use of hands   ? Standing Unsupported with Eyes Closed Able to stand 10 seconds safely   ? Standing Unsupported with Feet Together Able to place feet together independently and stand 1 minute safely   ? From Standing, Reach Forward with Outstretched Arm Can reach forward >12 cm safely (5")   ? From Standing Position, Pick up Object from Floor Able to pick up shoe, needs supervision   ? From Standing Position, Turn to Look Behind Over each Shoulder Looks behind from both sides and weight shifts well   ? Turn 360 Degrees Able to turn 360 degrees safely in 4 seconds or less   ? Standing Unsupported, Alternately Place Feet on Step/Stool Able to stand independently and safely and complete 8 steps in 20 seconds   ?  Standing Unsupported, One Foot in Front Able to plae foot ahead of the other independently and hold 30 seconds   ? Standing on One Leg Able to lift leg independently and hold 5-10 seconds   ? Total Score 52   ? ?  ?  ? ?  ?FOTO= 54 %; (Decreased from 57%)  ? ?Walking > 20 min= Patient and wife report he is able to walk some in stores and outdoors but probably not for 20 min yet. He was able to walk for 6 min straight today during 6 min walk test- Ongoing. ? ?BERG= 52/56; (improved from 47/56 and goal was 44/56)- GOAL MET ? ?5xSTS=10.69 sec; (improved from 13.88 sec without UE support) - GOAL MET ? ?TUG= 14.2 sec with cane; (improvedfrom 15.01 sec)  ? ?6MWT= 1100 feet; (improved from 960 feet with cane)   ? ? ?SLS time= left = 5 sec; Right = 6 sec ?  ? ? ? ? ? ? ? ? ? ? ? ? ? ? ? ? ? ? ? ? PT Education - 11/12/21 1308   ? ? Education Details Purpose/Rationale of PT Recert   ? Person(s) Educated Patient   ? Methods Explanation;Demonstration;Tactile cues;Verbal cues   ? Comprehension Verbalized understanding;Returned demonstration;Verbal cues required;Tactile cues required;Need further instruction   ? ?  ?  ? ?  ? ? ? PT Short Term Goals - 05/08/21 1017   ? ?  ? PT SHORT TERM GOAL #1  ? Title Pt will be independent with HEP in order to improve strength and balance in order to decrease fall risk and improve function at home and work.   ? Baseline 03/02/2021- Patient with no formal HEP in place; 05/08/2021- Patient able to verbalize good understanding of current HEP involving strengthening and mobility including ambulation with cane.   ? Time 6   ? Period Weeks   ? Status Achieved   ? Target Date 04/13/21   ?  ? PT SHORT TERM GOAL #2  ? Title Patient will demonstrate safe and modified independent sit to stand transfers for improved independence in the home and decreased caregiver assist.   ? Baseline 03/02/2021- Paitent requires min assist for safe transfers in clinic today; 05/08/2021= Patient able to demonstrate modified ind with all sit to stand transfers using BUE and a quad cane without difficulty.   ? Time 6   ? Period Weeks   ? Status Achieved   ? Target Date 04/13/21   ? ?  ?  ? ?  ? ? ? ? PT Long Term Goals - 11/12/21 0708   ? ?  ? PT LONG TERM GOAL #1  ? Title Pt will improve FOTO to target score of 73 to display perceived improvements in ability to complete ADL's.   ? Baseline 03/02/2021= 58%; 05/29/2021= 52%; 08/21/2021= 57%; 11/12/2021= 54%   ? Time 12   ? Period Weeks   ? Status On-going   ? Target Date 02/04/22   ?  ? PT LONG TERM GOAL #2  ? Title Pt will improve BERG by at least 13 points in order to demonstrate clinically significant improvement in balance.   ? Baseline 03/02/2021= 8/56; 05/29/2021= 37/56   ? Time  12   ? Period Weeks   ? Status Achieved   ?  ? PT LONG TERM GOAL #3  ? Title Pt will decrease 5TSTS by at least 10 seconds in order to demonstrate clinically significant improvement in LE strength.   ?  Baseline 03/02/2021=36.11 sec with BUE Support; 05/29/2021= 17.87 sec without UE support;   ? Time 12   ? Period Weeks   ? Status Achieved   ?  ? PT LONG TERM GOAL #4  ? Title Pt will decrease TUG to below 18 seconds/decrease in order to demonstrate decreased fall risk.   ? Baseline 03/02/2021= 45.14 sec using RW; 05/29/2021= 24.68 sec with SBQC; 08/21/2021=17.18 sec   ? Time 12   ? Period Weeks   ? Status Achieved   ? Target Date 08/21/21   ?  ? PT LONG TERM GOAL #5  ? Title Patient will be modified independent in walking on even/uneven surface with least restrictive assistive device, for 20+ minutes without rest break, reporting some difficulty or less to improve walking tolerance with community ambulation including grocery shopping, going to church,etc.   ? Baseline 03/02/2021= Patient limited to < 100 feet using RW: Patient currently walking around 300 feet with use of SBQC in clinic during sessions- not directly assessed today- will reassess next session. 08/21/2021- Patient ambulated in clinic using [facility] with close SBA for 750 feet. 11/12/2021=Patient and wife report he is able to walk some in stores and outdoors but probably not for 20 min yet. He was able to walk for 6 min straight today during 6 min walk test.   ? Time 12   ? Period Weeks   ? Status On-going   ? Target Date 02/04/22   ?  ? Additional Long Term Goals  ? Additional Long Term Goals Yes   ?  ? PT LONG TERM GOAL #6  ? Title Pt will improve BERG by at least 7 points in order to demonstrate clinically significant improvement in balance.   ? Baseline 05/29/2021= 37/56; 08/21/2021= 43/56; 09/26/2021= 47/56; BERG= 52/56   ? Time 12   ? Period Weeks   ? Status Achieved   ? Target Date 11/13/21   ?  ? PT LONG TERM GOAL #7  ? Title Pt will increase 10MWT by at least  0.13 m/s in order to demonstrate clinically significant improvement in community ambulation.   ? Baseline 05/29/2021= 0.41 m/s; 08/21/2021= 0.68 m/s with SBQC; 09/26/2021= 0.91 m/s using QC   ? Time 12   ? Per

## 2021-11-14 ENCOUNTER — Ambulatory Visit: Payer: Medicaid Other

## 2021-11-19 ENCOUNTER — Encounter: Payer: Medicaid Other | Admitting: Speech Pathology

## 2021-11-19 ENCOUNTER — Ambulatory Visit: Payer: Medicaid Other

## 2021-11-21 ENCOUNTER — Ambulatory Visit: Payer: Medicaid Other

## 2021-11-26 ENCOUNTER — Encounter: Payer: Medicaid Other | Admitting: Speech Pathology

## 2021-11-26 ENCOUNTER — Ambulatory Visit: Payer: Medicaid Other | Attending: Physical Medicine and Rehabilitation

## 2021-11-26 DIAGNOSIS — M6281 Muscle weakness (generalized): Secondary | ICD-10-CM | POA: Insufficient documentation

## 2021-11-26 DIAGNOSIS — R278 Other lack of coordination: Secondary | ICD-10-CM | POA: Insufficient documentation

## 2021-11-26 DIAGNOSIS — R269 Unspecified abnormalities of gait and mobility: Secondary | ICD-10-CM | POA: Insufficient documentation

## 2021-11-26 DIAGNOSIS — R2681 Unsteadiness on feet: Secondary | ICD-10-CM | POA: Insufficient documentation

## 2021-11-26 DIAGNOSIS — R262 Difficulty in walking, not elsewhere classified: Secondary | ICD-10-CM | POA: Insufficient documentation

## 2021-11-26 NOTE — Therapy (Signed)
[facility] ?[place], [place], [address] ?Phone: [phone]   Fax:  [phone] ? ?Physical Therapy Treatment ? ?Patient Details  ?Name: Dennis Mcintosh ?MRN: [id number] ?Date of Birth: [birth year] ?Referring Provider (PT): Dr. Ophelia Shoulder ? ? ?Encounter Date: 11/26/2021 ? ? PT End of Session - 11/26/21 1529   ? ? Visit Number 28   ? Number of Visits 39   ? Date for PT Re-Evaluation 02/04/22   ? Authorization Time Period Initial Certification [id number]; Recert -[id number]; Recert 0000000   ? Progress Note Due on Visit 30   ? PT Start Time 1520   ? PT Stop Time 1558   ? PT Time Calculation (min) 38 min   ? Equipment Utilized During Treatment Gait belt   ? Activity Tolerance Patient tolerated treatment well   ? Behavior During Therapy [facility] for tasks assessed/performed   ? ?  ?  ? ?  ? ? ?Past Medical History:  ?Diagnosis Date  ? Diabetes mellitus without complication ([place])   ? ? ?Past Surgical History:  ?Procedure Laterality Date  ? HEMORRHOID SURGERY    ? ? ?There were no vitals filed for this visit. ? ? Subjective Assessment - 11/26/21 1523   ? ? Subjective Patient reports doing well today and walked in using his cane and other hand holding onto his wife's arm.   ? Patient is accompained by: Family member;Interpreter   ? 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 S/p removal of trach/G tube on 5/27. He most recently presented to ED on 02/26/2021 due to nausea and vomiting. He was at [facility] rehab from 01/31/2021- 02/23/2021.   ? Limitations Lifting;Standing;Walking;House hold activities   ? How long can you sit comfortably? no issues   ? How long can you stand comfortably? < 5 min   ? How long can you walk comfortably? < 5 min   ? Patient Stated Goals I want to go back to work   ? Currently in Pain? No/denies   ? ?  ?  ? ?   ? ? ? ? ? ?INTERVENTIONS:  ? ?Neuromuscular re-ed:  ? ?Gait trainer  on [employer]x  treadmill x 6 min at 0.4 m/s- Time on each LE= 52% on Right; 48% on Left using right UE support- Focusing on heel strike and repetitive stepping without LOB.  ? ?Cone activity- Ambulating in/around 6 cones- forward x 4 trials then backward, then including sidestep/diagonals x4 rounds each. Patient with more trouble with backward gait.  ? ?resistive gait- Matrix cable system x 12.5 - x 8 forward then backward - Patient presents with increased unsteadiness initially yet did improve with practice. Difficulty more with backward walking and intermittent scissoring gait.  ? ?Tandem stance on 1/2 foam- Hold 5-20 sec - Variable each leg with initial unsteadiness yet did improve with practice. Patient reported activity as hard.  ? ?SLS- Hold 5-10 sec - More difficulty with standing on left LE today. ? ? ? ? ? ? ? ? ? ? ? ? ? ? ? ? ? ? ? PT Education - 11/27/21 1436   ? ? Education Details Exercise technique   ? Person(s) Educated Patient   ? Methods Explanation;Demonstration;Tactile cues;Verbal cues   ? Comprehension Verbalized understanding;Returned demonstration;Verbal cues required;Tactile cues required;Need further instruction   ? ?  ?  ? ?  ? ? ? PT Short Term Goals -  05/08/21 1017   ? ?  ? PT SHORT TERM GOAL #1  ? Title Pt will be independent with HEP in order to improve strength and balance in order to decrease fall risk and improve function at home and work.   ? Baseline 03/02/2021- Patient with no formal HEP in place; 05/08/2021- Patient able to verbalize good understanding of current HEP involving strengthening and mobility including ambulation with cane.   ? Time 6   ? Period Weeks   ? Status Achieved   ? Target Date 04/13/21   ?  ? PT SHORT TERM GOAL #2  ? Title Patient will demonstrate safe and modified independent sit to stand transfers for improved independence in the home and decreased caregiver assist.   ? Baseline 03/02/2021-  Paitent requires min assist for safe transfers in clinic today; 05/08/2021= Patient able to demonstrate modified ind with all sit to stand transfers using BUE and a quad cane without difficulty.   ? Time 6   ? Period Weeks   ? Status Achieved   ? Target Date 04/13/21   ? ?  ?  ? ?  ? ? ? ? PT Long Term Goals - 11/12/21 0708   ? ?  ? PT LONG TERM GOAL #1  ? Title Pt will improve FOTO to target score of 73 to display perceived improvements in ability to complete ADL's.   ? Baseline 03/02/2021= 58%; 05/29/2021= 52%; 08/21/2021= 57%; 11/12/2021= 54%   ? Time 12   ? Period Weeks   ? Status On-going   ? Target Date 02/04/22   ?  ? PT LONG TERM GOAL #2  ? Title Pt will improve BERG by at least 13 points in order to demonstrate clinically significant improvement in balance.   ? Baseline 03/02/2021= 8/56; 05/29/2021= 37/56   ? Time 12   ? Period Weeks   ? Status Achieved   ?  ? PT LONG TERM GOAL #3  ? Title Pt will decrease 5TSTS by at least 10 seconds in order to demonstrate clinically significant improvement in LE strength.   ? Baseline 03/02/2021=36.11 sec with BUE Support; 05/29/2021= 17.87 sec without UE support;   ? Time 12   ? Period Weeks   ? Status Achieved   ?  ? PT LONG TERM GOAL #4  ? Title Pt will decrease TUG to below 18 seconds/decrease in order to demonstrate decreased fall risk.   ? Baseline 03/02/2021= 45.14 sec using RW; 05/29/2021= 24.68 sec with SBQC; 08/21/2021=17.18 sec   ? Time 12   ? Period Weeks   ? Status Achieved   ? Target Date 08/21/21   ?  ? PT LONG TERM GOAL #5  ? Title Patient will be modified independent in walking on even/uneven surface with least restrictive assistive device, for 20+ minutes without rest break, reporting some difficulty or less to improve walking tolerance with community ambulation including grocery shopping, going to church,etc.   ? Baseline 03/02/2021= Patient limited to < 100 feet using RW: Patient currently walking around 300 feet with use of SBQC in clinic during sessions- not  directly assessed today- will reassess next session. 08/21/2021- Patient ambulated in clinic using [facility] with close SBA for 750 feet. 11/12/2021=Patient and wife report he is able to walk some in stores and outdoors but probably not for 20 min yet. He was able to walk for 6 min straight today during 6 min walk test.   ? Time 12   ? Period Weeks   ?  Status On-going   ? Target Date 02/04/22   ?  ? Additional Long Term Goals  ? Additional Long Term Goals Yes   ?  ? PT LONG TERM GOAL #6  ? Title Pt will improve BERG by at least 7 points in order to demonstrate clinically significant improvement in balance.   ? Baseline 05/29/2021= 37/56; 08/21/2021= 43/56; 09/26/2021= 47/56; BERG= 52/56   ? Time 12   ? Period Weeks   ? Status Achieved   ? Target Date 11/13/21   ?  ? PT LONG TERM GOAL #7  ? Title Pt will increase 10MWT by at least 0.13 m/s in order to demonstrate clinically significant improvement in community ambulation.   ? Baseline 05/29/2021= 0.41 m/s; 08/21/2021= 0.68 m/s with SBQC; 09/26/2021= 0.91 m/s using QC   ? Time 12   ? Period Weeks   ? Status Achieved   ? Target Date 08/21/21   ?  ? PT LONG TERM GOAL #8  ? Title Pt will decrease 5TSTS by at least 3 seconds in order to demonstrate clinically significant improvement in LE strength.   ? Baseline 08/21/2021=15.18 sec; 09/26/2021= 13.88 sec without an AD; 11/12/2021= 10.69 sec   ? Time 12   ? Period Weeks   ? Status Achieved   ? Target Date 11/13/21   ?  ? PT LONG TERM GOAL  #9  ? TITLE Pt will decrease TUG to below 14 seconds/decrease in order to demonstrate decreased fall risk.   ? Baseline 08/21/2021= 17.80 sec with SBQC; 09/26/2021= 15.01 sec; 11/12/2021=14.2 sec   ? Time 12   ? Period Weeks   ? Status New   ? Target Date 11/12/21   ?  ? PT LONG TERM GOAL  #10  ? TITLE Pt will increase 6MWT by at least 22m (137ft) in order to demonstrate clinically significant improvement in cardiopulmonary endurance and community ambulation   ? Baseline 08/21/2021= 750 feet with SBQC; 3/1/203=  960 feet with use of SBQC; 11/12/2021= 1100 feet with use of SPC   ? Time 12   ? Period Weeks   ? Status Achieved   ? Target Date 11/13/21   ?  ? PT LONG TERM GOAL  #11  ? TITLE Patient will demonstrate improved overa

## 2021-12-03 ENCOUNTER — Ambulatory Visit: Payer: Medicaid Other

## 2021-12-03 ENCOUNTER — Encounter: Payer: Medicaid Other | Admitting: Speech Pathology

## 2021-12-04 ENCOUNTER — Ambulatory Visit: Payer: Medicaid Other

## 2021-12-04 DIAGNOSIS — R269 Unspecified abnormalities of gait and mobility: Secondary | ICD-10-CM | POA: Diagnosis not present

## 2021-12-04 DIAGNOSIS — R278 Other lack of coordination: Secondary | ICD-10-CM

## 2021-12-04 DIAGNOSIS — R262 Difficulty in walking, not elsewhere classified: Secondary | ICD-10-CM

## 2021-12-04 DIAGNOSIS — M6281 Muscle weakness (generalized): Secondary | ICD-10-CM

## 2021-12-04 DIAGNOSIS — R2681 Unsteadiness on feet: Secondary | ICD-10-CM

## 2021-12-04 NOTE — Therapy (Signed)
[place] [facility] ?[address] ?[place], [place], [address] ?Phone: [phone]   Fax:  [phone] ? ?Physical Therapy Treatment ? ?Patient Details  ?Name: Dennis Mcintosh ?MRN: [id number] ?Date of Birth: 02-20-1966 ?Referring Provider (PT): Dr. Windle Guard ? ? ?Encounter Date: 12/04/2021 ? ? PT End of Session - 12/04/21 1321   ? ? Visit Number 29   ? Number of Visits 39   ? Date for PT Re-Evaluation 02/04/22   ? Authorization Time Period Initial Certification 03/02/2021-05/25/2021; Recert -08/21/2021; Recert 08/21/2021-11/13/2021   ? Progress Note Due on Visit 30   ? PT Start Time 1309   ? PT Stop Time 1344   ? PT Time Calculation (min) 35 min   ? Equipment Utilized During Treatment Gait belt   ? Activity Tolerance Patient tolerated treatment well   ? Behavior During Therapy [facility] for tasks assessed/performed   ? ?  ?  ? ?  ? ? ?Past Medical History:  ?Diagnosis Date  ? Diabetes mellitus without complication ([facility])   ? ? ?Past Surgical History:  ?Procedure Laterality Date  ? HEMORRHOID SURGERY    ? ? ?There were no vitals filed for this visit. ? ? Subjective Assessment - 12/04/21 1317   ? ? Subjective Patient reports he is walking in the home now without a cane and only using cane when he leaves the home.   ? Patient is accompained by: Family member;Interpreter   ? 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 S/p removal of trach/G tube on 5/27. He most recently presented to ED on 02/26/2021 due to nausea and vomiting. He was at [facility] rehab from 01/31/2021- 02/23/2021.   ? Limitations Lifting;Standing;Walking;House hold activities   ? How long can you sit comfortably? no issues   ? How long can you stand comfortably? < 5 min   ? How long can you walk comfortably? < 5 min   ? Patient Stated Goals I want to go back to work   ? Currently in Pain? No/denies   ? ?  ?  ? ?   ? ? ?INTERVENTIONS:  ?Neuromuscular re-education:  ? ?-Ambulation in // bars over Hurdles x 2 and 1/2 foam- Forward/backward/sidesteps x 10 reps down and back without UE support focusing on improving step length/height. *patient does present with mild scissoring gait at times.  ? ?Ladder drills - Forward/backward (1 foot per square) -for ? ?Cone taps- 2 cones set up about 8 feet apart performing forward/backward/side steps to cone- squat and tap with 1 UE ? ?Gait in // bars with 2x4 board in middle to force patient to walk with wider step to decrease scissor gait.  ? ?Timed step tap onto purple pad without UE support x 3 trials: ?1) 29 sec (20 steps single count)  ?2) 24.5 sec (20 steps single count)  ?3) 21.76 sec (20 steps single count)  ? ? ? ? ? ? ? ? ? ? ? ? ? ? ? ? ? ? ? ? ? ? ? ? ? ? ? ? PT Education - 12/04/21 2346   ? ? Education Details Exercise technique   ? Person(s) Educated Patient   ? Methods Explanation;Demonstration;Tactile cues;Verbal cues   ? Comprehension Verbalized understanding;Returned demonstration;Verbal cues required;Tactile cues required;Need further instruction   ? ?  ?  ? ?  ? ? ? PT Short Term Goals - 05/08/21 1017   ? ?  ?  PT SHORT TERM GOAL #1  ? Title Pt will be independent with HEP in order to improve strength and balance in order to decrease fall risk and improve function at home and work.   ? Baseline 03/02/2021- Patient with no formal HEP in place; 05/08/2021- Patient able to verbalize good understanding of current HEP involving strengthening and mobility including ambulation with cane.   ? Time 6   ? Period Weeks   ? Status Achieved   ? Target Date 04/13/21   ?  ? PT SHORT TERM GOAL #2  ? Title Patient will demonstrate safe and modified independent sit to stand transfers for improved independence in the home and decreased caregiver assist.   ? Baseline 03/02/2021- Paitent requires min assist for safe transfers in clinic today; 05/08/2021= Patient able to demonstrate modified ind  with all sit to stand transfers using BUE and a quad cane without difficulty.   ? Time 6   ? Period Weeks   ? Status Achieved   ? Target Date 04/13/21   ? ?  ?  ? ?  ? ? ? ? ? PT Long Term Goals - 11/12/21 0708   ? ?  ? PT LONG TERM GOAL #1  ? Title Pt will improve FOTO to target score of 73 to display perceived improvements in ability to complete ADL's.   ? Baseline 03/02/2021= 58%; 05/29/2021= 52%; 08/21/2021= 57%; 11/12/2021= 54%   ? Time 12   ? Period Weeks   ? Status On-going   ? Target Date 02/04/22   ?  ? PT LONG TERM GOAL #2  ? Title Pt will improve BERG by at least 13 points in order to demonstrate clinically significant improvement in balance.   ? Baseline 03/02/2021= 8/56; 05/29/2021= 37/56   ? Time 12   ? Period Weeks   ? Status Achieved   ?  ? PT LONG TERM GOAL #3  ? Title Pt will decrease 5TSTS by at least 10 seconds in order to demonstrate clinically significant improvement in LE strength.   ? Baseline 03/02/2021=36.11 sec with BUE Support; 05/29/2021= 17.87 sec without UE support;   ? Time 12   ? Period Weeks   ? Status Achieved   ?  ? PT LONG TERM GOAL #4  ? Title Pt will decrease TUG to below 18 seconds/decrease in order to demonstrate decreased fall risk.   ? Baseline 03/02/2021= 45.14 sec using RW; 05/29/2021= 24.68 sec with SBQC; 08/21/2021=17.18 sec   ? Time 12   ? Period Weeks   ? Status Achieved   ? Target Date 08/21/21   ?  ? PT LONG TERM GOAL #5  ? Title Patient will be modified independent in walking on even/uneven surface with least restrictive assistive device, for 20+ minutes without rest break, reporting some difficulty or less to improve walking tolerance with community ambulation including grocery shopping, going to church,etc.   ? Baseline 03/02/2021= Patient limited to < 100 feet using RW: Patient currently walking around 300 feet with use of SBQC in clinic during sessions- not directly assessed today- will reassess next session. 08/21/2021- Patient ambulated in clinic using [facility] with close SBA for  750 feet. 11/12/2021=Patient and wife report he is able to walk some in stores and outdoors but probably not for 20 min yet. He was able to walk for 6 min straight today during 6 min walk test.   ? Time 12   ? Period Weeks   ? Status On-going   ?  Target Date 02/04/22   ?  ? Additional Long Term Goals  ? Additional Long Term Goals Yes   ?  ? PT LONG TERM GOAL #6  ? Title Pt will improve BERG by at least 7 points in order to demonstrate clinically significant improvement in balance.   ? Baseline 05/29/2021= 37/56; 08/21/2021= 43/56; 09/26/2021= 47/56; BERG= 52/56   ? Time 12   ? Period Weeks   ? Status Achieved   ? Target Date 11/13/21   ?  ? PT LONG TERM GOAL #7  ? Title Pt will increase 10MWT by at least 0.13 m/s in order to demonstrate clinically significant improvement in community ambulation.   ? Baseline 05/29/2021= 0.41 m/s; 08/21/2021= 0.68 m/s with SBQC; 09/26/2021= 0.91 m/s using QC   ? Time 12   ? Period Weeks   ? Status Achieved   ? Target Date 08/21/21   ?  ? PT LONG TERM GOAL #8  ? Title Pt will decrease 5TSTS by at least 3 seconds in order to demonstrate clinically significant improvement in LE strength.   ? Baseline 08/21/2021=15.18 sec; 09/26/2021= 13.88 sec without an AD; 11/12/2021= 10.69 sec   ? Time 12   ? Period Weeks   ? Status Achieved   ? Target Date 11/13/21   ?  ? PT LONG TERM GOAL  #9  ? TITLE Pt will decrease TUG to below 14 seconds/decrease in order to demonstrate decreased fall risk.   ? Baseline 08/21/2021= 17.80 sec with SBQC; 09/26/2021= 15.01 sec; 11/12/2021=14.2 sec   ? Time 12   ? Period Weeks   ? Status New   ? Target Date 11/12/21   ?  ? PT LONG TERM GOAL  #10  ? TITLE Pt will increase 6MWT by at least 3382m (18664ft) in order to demonstrate clinically significant improvement in cardiopulmonary endurance and community ambulation   ? Baseline 08/21/2021= 750 feet with SBQC; 3/1/203= 960 feet with use of SBQC; 11/12/2021= 1100 feet with use of SPC   ? Time 12   ? Period Weeks   ? Status Achieved   ?  Target Date 11/13/21   ?  ? PT LONG TERM GOAL  #11  ? TITLE Patient will demonstrate improved overall standing balance as seen by ability to hold SLS > 10 sec each LE for improved dynamic balance in home and com

## 2021-12-10 ENCOUNTER — Encounter: Payer: Medicaid Other | Admitting: Speech Pathology

## 2021-12-10 ENCOUNTER — Ambulatory Visit: Payer: Medicaid Other

## 2021-12-10 DIAGNOSIS — R278 Other lack of coordination: Secondary | ICD-10-CM

## 2021-12-10 DIAGNOSIS — R269 Unspecified abnormalities of gait and mobility: Secondary | ICD-10-CM

## 2021-12-10 DIAGNOSIS — M6281 Muscle weakness (generalized): Secondary | ICD-10-CM

## 2021-12-10 DIAGNOSIS — R2681 Unsteadiness on feet: Secondary | ICD-10-CM

## 2021-12-10 DIAGNOSIS — R262 Difficulty in walking, not elsewhere classified: Secondary | ICD-10-CM

## 2021-12-10 NOTE — Therapy (Signed)
[facility] ?[place], [place], [address] ?Phone: [phone]   Fax:  [phone] ? ?Physical Therapy Treatment/Physical Therapy Progress Note ? ? ?Dates of reporting period  09/26/2021  to   12/10/2021 ? ?Patient Details  ?Name: Dennis Mcintosh ?MRN: [id number] ?Date of Birth: November 08, 1965 ?Referring Provider (PT): Dr. Ophelia Shoulder ? ? ?Encounter Date: 12/10/2021 ? ? PT End of Session - 12/10/21 1313   ? ? Visit Number 30   ? Number of Visits 39   ? Date for PT Re-Evaluation 02/04/22   ? Authorization Time Period Initial Certification [id number]; Recert -[id number]; Recert 0000000   ? Progress Note Due on Visit 30   ? PT Start Time 1310   ? PT Stop Time 1344   ? PT Time Calculation (min) 34 min   ? Equipment Utilized During Treatment Gait belt   ? Activity Tolerance Patient tolerated treatment well   ? Behavior During Therapy [facility] for tasks assessed/performed   ? ?  ?  ? ?  ? ? ?Past Medical History:  ?Diagnosis Date  ? Diabetes mellitus without complication ([place])   ? ? ?Past Surgical History:  ?Procedure Laterality Date  ? HEMORRHOID SURGERY    ? ? ?There were no vitals filed for this visit. ? ? Subjective Assessment - 12/10/21 1312   ? ? Subjective Patient denies any falls and no pain   ? Patient is accompained by: Family member;Interpreter   ? 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 S/p removal of trach/G tube on 5/27. He most recently presented to ED on 02/26/2021 due to nausea and vomiting. He was at [facility] from 01/31/2021- 02/23/2021.   ? Limitations Lifting;Standing;Walking;House hold activities   ? How long can you sit comfortably? no issues   ? How long can you stand comfortably? < 5 min   ? How long can you walk comfortably? < 5 min   ? Patient Stated Goals I want to go back to work   ? Currently in Pain? No/denies    ? ?  ?  ? ?  ? ? ?INTERVENTIONS: ? ?Patient arrived 10 min late for scheduled appointment ? ? ?Gait with horizontal head turns- calling out items on sticky notes- performed with and without Cane. Some unsteadiness- lateral gait deviation and slower cadence to complete.  Approx 300+ feet with VC for safe mobility ? ?Single leg stance: 20 sec on Left and 9 sec on right LE (best of mult attempts)  ? ?Tandem standing at support bar x 30 sec x 3sets ? ?TUG: 1) 15.22 sec with cane 2) 14.23 sec with cane 3) 14.22 sec without AD  4) 12.66 sec without UE support. ? ?Step tap (onto hedgehog placed on 2nd step) with no UE support and  ? ? ? ? ?    ? ? ? ? ? ? ? ? ? ? ? ? ? ? ? ? PT Education - 12/10/21 1356   ? ? Education Details gait cues for safe mobility   ? Person(s) Educated Patient   ? Methods Explanation;Demonstration;Tactile cues;Verbal cues   ? Comprehension Verbalized understanding;Returned demonstration;Need further instruction;Tactile cues required;Verbal cues required   ? ?  ?  ? ?  ? ? ? PT Short Term Goals - 05/08/21 1017   ? ?  ? PT SHORT TERM GOAL #1  ? Title Pt will be independent with HEP in  order to improve strength and balance in order to decrease fall risk and improve function at home and work.   ? Baseline 03/02/2021- Patient with no formal HEP in place; 05/08/2021- Patient able to verbalize good understanding of current HEP involving strengthening and mobility including ambulation with cane.   ? Time 6   ? Period Weeks   ? Status Achieved   ? Target Date 04/13/21   ?  ? PT SHORT TERM GOAL #2  ? Title Patient will demonstrate safe and modified independent sit to stand transfers for improved independence in the home and decreased caregiver assist.   ? Baseline 03/02/2021- Paitent requires min assist for safe transfers in clinic today; 05/08/2021= Patient able to demonstrate modified ind with all sit to stand transfers using BUE and a quad cane without difficulty.   ? Time 6   ? Period Weeks   ? Status  Achieved   ? Target Date 04/13/21   ? ?  ?  ? ?  ? ? ? ? PT Long Term Goals - 12/10/21 1347   ? ?  ? PT LONG TERM GOAL #1  ? Title Pt will improve FOTO to target score of 73 to display perceived improvements in ability to complete ADL's.   ? Baseline 03/02/2021= 58%; 05/29/2021= 52%; 08/21/2021= 57%; 11/12/2021= 54%   ? Time 12   ? Period Weeks   ? Status On-going   ? Target Date 02/04/22   ?  ? PT LONG TERM GOAL #2  ? Title Pt will improve BERG by at least 13 points in order to demonstrate clinically significant improvement in balance.   ? Baseline 03/02/2021= 8/56; 05/29/2021= 37/56   ? Time 12   ? Period Weeks   ? Status Achieved   ?  ? PT LONG TERM GOAL #3  ? Title Pt will decrease 5TSTS by at least 10 seconds in order to demonstrate clinically significant improvement in LE strength.   ? Baseline 03/02/2021=36.11 sec with BUE Support; 05/29/2021= 17.87 sec without UE support;   ? Time 12   ? Period Weeks   ? Status Achieved   ?  ? PT LONG TERM GOAL #4  ? Title Pt will decrease TUG to below 18 seconds/decrease in order to demonstrate decreased fall risk.   ? Baseline 03/02/2021= 45.14 sec using RW; 05/29/2021= 24.68 sec with SBQC; 08/21/2021=17.18 sec   ? Time 12   ? Period Weeks   ? Status Achieved   ? Target Date 08/21/21   ?  ? PT LONG TERM GOAL #5  ? Title Patient will be modified independent in walking on even/uneven surface with least restrictive assistive device, for 20+ minutes without rest break, reporting some difficulty or less to improve walking tolerance with community ambulation including grocery shopping, going to church,etc.   ? Baseline 03/02/2021= Patient limited to < 100 feet using RW: Patient currently walking around 300 feet with use of SBQC in clinic during sessions- not directly assessed today- will reassess next session. 08/21/2021- Patient ambulated in clinic using [facility] with close SBA for 750 feet. 11/12/2021=Patient and wife report he is able to walk some in stores and outdoors but probably not for 20 min  yet. He was able to walk for 6 min straight today during 6 min walk test.   ? Time 12   ? Period Weeks   ? Status On-going   ? Target Date 02/04/22   ?  ? PT LONG TERM GOAL #6  ? Title  Pt will improve BERG by at least 7 points in order to demonstrate clinically significant improvement in balance.   ? Baseline 05/29/2021= 37/56; 08/21/2021= 43/56; 09/26/2021= 47/56; BERG= 52/56   ? Time 12   ? Period Weeks   ? Status Achieved   ? Target Date 11/13/21   ?  ? PT LONG TERM GOAL #7  ? Title Pt will increase 10MWT by at least 0.13 m/s in order to demonstrate clinically significant improvement in community ambulation.   ? Baseline 05/29/2021= 0.41 m/s; 08/21/2021= 0.68 m/s with SBQC; 09/26/2021= 0.91 m/s using QC   ? Time 12   ? Period Weeks   ? Status Achieved   ? Target Date 08/21/21   ?  ? PT LONG TERM GOAL #8  ? Title Pt will decrease 5TSTS by at least 3 seconds in order to demonstrate clinically significant improvement in LE strength.   ? Baseline 08/21/2021=15.18 sec; 09/26/2021= 13.88 sec without an AD; 11/12/2021= 10.69 sec   ? Time 12   ? Period Weeks   ? Status Achieved   ? Target Date 11/13/21   ?  ? PT LONG TERM GOAL  #9  ? TITLE Pt will decrease TUG to below 14 seconds/decrease in order to demonstrate decreased fall risk.   ? Baseline 08/21/2021= 17.80 sec with SBQC; 09/26/2021= 15.01 sec; 11/12/2021=14.2 sec; 12/10/2021=1) 15.22 sec with cane 2) 14.23 sec with cane 3) 14.22 sec without AD  4) 12.66 sec without UE support.   ? Time 12   ? Period Weeks   ? Status On-going   ? Target Date 02/04/22   ?  ? PT LONG TERM GOAL  #10  ? TITLE Pt will increase 6MWT by at least 51m (131ft) in order to demonstrate clinically significant improvement in cardiopulmonary endurance and community ambulation   ? Baseline 08/21/2021= 750 feet with SBQC; 3/1/203= 960 feet with use of SBQC; 11/12/2021= 1100 feet with use of SPC   ? Time 12   ? Period Weeks   ? Status Achieved   ? Target Date 11/13/21   ?  ? PT LONG TERM GOAL  #11  ? TITLE Patient  will demonstrate improved overall standing balance as seen by ability to hold SLS > 10 sec each LE for improved dynamic balance in home and community including unlevel terrain and steps.   ? Baseline 4/17/2

## 2021-12-17 ENCOUNTER — Ambulatory Visit: Payer: Medicaid Other

## 2021-12-19 ENCOUNTER — Ambulatory Visit: Payer: Medicaid Other

## 2021-12-19 ENCOUNTER — Encounter: Payer: Medicaid Other | Admitting: Speech Pathology

## 2021-12-19 DIAGNOSIS — R2681 Unsteadiness on feet: Secondary | ICD-10-CM

## 2021-12-19 DIAGNOSIS — R278 Other lack of coordination: Secondary | ICD-10-CM

## 2021-12-19 DIAGNOSIS — R262 Difficulty in walking, not elsewhere classified: Secondary | ICD-10-CM

## 2021-12-19 DIAGNOSIS — M6281 Muscle weakness (generalized): Secondary | ICD-10-CM

## 2021-12-19 DIAGNOSIS — R269 Unspecified abnormalities of gait and mobility: Secondary | ICD-10-CM | POA: Diagnosis not present

## 2021-12-19 NOTE — Therapy (Signed)
[place] [facility] [address] [place], [place], [address] Phone: [phone]   Fax:  [phone]  Physical Therapy Treatment  Patient Details  Name: Dennis Mcintosh MRN: [id number] Date of Birth: 1965-10-11 Referring Provider (PT): Dr. Ophelia Shoulder   Encounter Date: 12/19/2021   PT End of Session - 12/19/21 1306     Visit Number 31    Number of Visits 39    Date for PT Re-Evaluation 02/04/22    Authorization Time Period Initial Certification 10/27/2876-67/67/2094; Recert -02/03/6282; Recert 6/62/9476-5/46/5035    Progress Note Due on Visit 30    PT Start Time 1305    PT Stop Time 1344    PT Time Calculation (min) 39 min    Equipment Utilized During Treatment Gait belt    Activity Tolerance Patient tolerated treatment well    Behavior During Therapy WFL for tasks assessed/performed             Past Medical History:  Diagnosis Date   Diabetes mellitus without complication ([place])     Past Surgical History:  Procedure Laterality Date   HEMORRHOID SURGERY      There were no vitals filed for this visit.   Subjective Assessment - 12/19/21 1309     Subjective Patient reports near fall in home on Monday- walking and grabbed onto couch.    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 S/p removal of trach/G tube on 5/27. He most recently presented to ED on 02/26/2021 due to nausea and vomiting. He was at [facility] from 01/31/2021- 02/23/2021.    Limitations Lifting;Standing;Walking;House hold activities    How long can you sit comfortably? no issues    How long can you stand comfortably? < 5 min    How long can you walk comfortably? < 5 min    Patient Stated Goals I want to go back to work    Currently in Pain? No/denies                 INTERVENTIONS:     Neuromuscular re-education: CGA , use of gait belt, and no AD utilized  - gait with trunk twist - passing ball to PT from left to right while walking 10 meters x 8 sets  Gait (10 m) with dynamic horizontal and vertical head turns- x 4 sets - down and back- Mild lateral gait deviation yet no LOB.   Gait (52m with dynamic ball bouncing - intially attempted left to right hand bounce x 2 trials- then just right UE - down and back (total of 20 m) for 5 trials. Patient initially with increased difficutly with dual task requiring increased time to complete task- did improve with repetition.   Gait in longer hallway- approx 70 feet one direction- with horizontal head turning calling out items on sticky notes- Approx 60 % accuracy with mild Unsteadiness noted. He did improve with multiple trials- less lateral gait unsteadiness with practice.   Sit to stand with feet staggered x 10 reps each LE   Education provided throughout session via VC/TC and demonstration to facilitate movement at target joints and correct muscle activation for all testing and exercises performed.                 PT Short Term Goals - 05/08/21 1017       PT SHORT TERM GOAL #1  Title Pt will be independent with HEP in order to improve strength and balance in order to decrease fall risk and improve function at home and work.    Baseline 03/02/2021- Patient with no formal HEP in place; 05/08/2021- Patient able to verbalize good understanding of current HEP involving strengthening and mobility including ambulation with cane.    Time 6    Period Weeks    Status Achieved    Target Date 04/13/21      PT SHORT TERM GOAL #2   Title Patient will demonstrate safe and modified independent sit to stand transfers for improved independence in the home and decreased caregiver assist.    Baseline 03/02/2021- Paitent requires min assist for safe transfers in clinic today; 05/08/2021= Patient able to demonstrate modified ind  with all sit to stand transfers using BUE and a quad cane without difficulty.    Time 6    Period Weeks    Status Achieved    Target Date 04/13/21               PT Long Term Goals - 12/10/21 1347       PT LONG TERM GOAL #1   Title Pt will improve FOTO to target score of 73 to display perceived improvements in ability to complete ADL's.    Baseline 03/02/2021= 58%; 05/29/2021= 52%; 08/21/2021= 57%; 11/12/2021= 54%    Time 12    Period Weeks    Status On-going    Target Date 02/04/22      PT LONG TERM GOAL #2   Title Pt will improve BERG by at least 13 points in order to demonstrate clinically significant improvement in balance.    Baseline 03/02/2021= 8/56; 05/29/2021= 37/56    Time 12    Period Weeks    Status Achieved      PT LONG TERM GOAL #3   Title Pt will decrease 5TSTS by at least 10 seconds in order to demonstrate clinically significant improvement in LE strength.    Baseline 03/02/2021=36.11 sec with BUE Support; 05/29/2021= 17.87 sec without UE support;    Time 12    Period Weeks    Status Achieved      PT LONG TERM GOAL #4   Title Pt will decrease TUG to below 18 seconds/decrease in order to demonstrate decreased fall risk.    Baseline 03/02/2021= 45.14 sec using RW; 05/29/2021= 24.68 sec with SBQC; 08/21/2021=17.18 sec    Time 12    Period Weeks    Status Achieved    Target Date 08/21/21      PT LONG TERM GOAL #5   Title Patient will be modified independent in walking on even/uneven surface with least restrictive assistive device, for 20+ minutes without rest break, reporting some difficulty or less to improve walking tolerance with community ambulation including grocery shopping, going to church,etc.    Baseline 03/02/2021= Patient limited to < 100 feet using RW: Patient currently walking around 300 feet with use of SBQC in clinic during sessions- not directly assessed today- will reassess next session. 08/21/2021- Patient ambulated in clinic using [facility] with close SBA for  750 feet. 11/12/2021=Patient and wife report he is able to walk some in stores and outdoors but probably not for 20 min yet. He was able to walk for 6 min straight today during 6 min walk test.    Time 12    Period Weeks    Status On-going    Target Date 02/04/22  PT LONG TERM GOAL #6   Title Pt will improve BERG by at least 7 points in order to demonstrate clinically significant improvement in balance.    Baseline 05/29/2021= 37/56; 08/21/2021= 43/56; 09/26/2021= 47/56; BERG= 52/56    Time 12    Period Weeks    Status Achieved    Target Date 11/13/21      PT LONG TERM GOAL #7   Title Pt will increase 10MWT by at least 0.13 m/s in order to demonstrate clinically significant improvement in community ambulation.    Baseline 05/29/2021= 0.41 m/s; 08/21/2021= 0.68 m/s with SBQC; 09/26/2021= 0.91 m/s using QC    Time 12    Period Weeks    Status Achieved    Target Date 08/21/21      PT LONG TERM GOAL #8   Title Pt will decrease 5TSTS by at least 3 seconds in order to demonstrate clinically significant improvement in LE strength.    Baseline 08/21/2021=15.18 sec; 09/26/2021= 13.88 sec without an AD; 11/12/2021= 10.69 sec    Time 12    Period Weeks    Status Achieved    Target Date 11/13/21      PT LONG TERM GOAL  #9   TITLE Pt will decrease TUG to below 14 seconds/decrease in order to demonstrate decreased fall risk.    Baseline 08/21/2021= 17.80 sec with SBQC; 09/26/2021= 15.01 sec; 11/12/2021=14.2 sec; 12/10/2021=1) 15.22 sec with cane 2) 14.23 sec with cane 3) 14.22 sec without AD  4) 12.66 sec without UE support.    Time 12    Period Weeks    Status On-going    Target Date 02/04/22      PT LONG TERM GOAL  #10   TITLE Pt will increase 6MWT by at least 22m(1636f in order to demonstrate clinically significant improvement in cardiopulmonary endurance and community ambulation    Baseline 08/21/2021= 750 feet with SBQC; 3/1/203= 960 feet with use of SBQC; 11/12/2021= 1100 feet with use of SPC     Time 12    Period Weeks    Status Achieved    Target Date 11/13/21      PT LONG TERM GOAL  #11   TITLE Patient will demonstrate improved overall standing balance as seen by ability to hold SLS > 10 sec each LE for improved dynamic balance in home and community including unlevel terrain and steps.    Baseline 11/12/2021= L: 5 sec; R: 6 sec; 12/10/2021=20 sec on Left and 9 sec on right LE (best of mult attempts)    Time 12    Period Weeks    Status Partially Met    Target Date 02/04/22                   Plan - 12/19/21 1306     Clinical Impression Statement Patient presents with good motivation and performed well working the entire session without an assistive device. He was challenged with dual task but improved performance with practice and no scissoring of gait today. He was unsure of himself with ball activity but improved with each trial today. Patient will continue to benefit from skilled PT services to continue to progress his LE strength and all functional mobility for improved household and community distances and improved overall quality of life.    Personal Factors and Comorbidities Comorbidity 1    Comorbidities diabetes    Examination-Activity Limitations Bathing;Bed Mobility;Bend;Caring for Others;Carry;Dressing;Lift;Locomotion Level;Squat;Stairs;Stand;Transfers    Examination-Participation Restrictions Cleaning;Community Activity;Driving;Occupation;[place]  Stability/Clinical Decision Making Stable/Uncomplicated    Rehab Potential Good    PT Frequency 1x / week    PT Duration 12 weeks    PT Treatment/Interventions ADLs/Self Care Home Management;Cryotherapy;Moist Heat;DME Instruction;Gait training;Stair training;Functional mobility training;Therapeutic activities;Therapeutic exercise;Balance training;Neuromuscular re-education;Patient/family education;Manual techniques;Passive range of motion;Dry needling;Canalith Repostioning;Vestibular    PT Next Visit Plan  Continue with Balance and progress LE strengthening activities as appropriate.    PT Home Exercise Plan no changes    Consulted and Agree with Plan of Care Patient;Family member/caregiver;Other (Comment)   [place], Interpreter   Family Member Consulted Wife             Patient will benefit from skilled therapeutic intervention in order to improve the following deficits and impairments:  Abnormal gait, Decreased activity tolerance, Decreased balance, Decreased coordination, Decreased endurance, Decreased mobility, Difficulty walking, Decreased strength  Visit Diagnosis: Abnormality of gait and mobility  Difficulty in walking, not elsewhere classified  Muscle weakness (generalized)  Other lack of coordination  Unsteadiness on feet     Problem List There are no problems to display for this patient.   Lewis Moccasin, PT 12/20/2021, 11:44 AM  [place] [facility] [address] [place], [place], [address] Phone: [phone]   Fax:  [phone]  Name: Taariq Leitz MRN: [id number] Date of Birth: 1966/02/10

## 2021-12-25 ENCOUNTER — Ambulatory Visit: Payer: Medicaid Other

## 2021-12-25 ENCOUNTER — Encounter: Payer: Medicaid Other | Admitting: Speech Pathology

## 2021-12-25 DIAGNOSIS — R269 Unspecified abnormalities of gait and mobility: Secondary | ICD-10-CM | POA: Diagnosis not present

## 2021-12-25 DIAGNOSIS — R278 Other lack of coordination: Secondary | ICD-10-CM

## 2021-12-25 DIAGNOSIS — R2681 Unsteadiness on feet: Secondary | ICD-10-CM

## 2021-12-25 DIAGNOSIS — R262 Difficulty in walking, not elsewhere classified: Secondary | ICD-10-CM

## 2021-12-25 DIAGNOSIS — M6281 Muscle weakness (generalized): Secondary | ICD-10-CM

## 2021-12-25 NOTE — Therapy (Unsigned)
[place] MAIN [facility] SERVICES [address] [place], [place], [address] Phone: [phone]   Fax:  [phone]  Physical Therapy Treatment  Patient Details  Name: Obi Scrima MRN: [id number] Date of Birth: 1966-03-11 Referring Provider (PT): Dr. Ophelia Shoulder   Encounter Date: 12/25/2021    Past Medical History:  Diagnosis Date   Diabetes mellitus without complication [facility])     Past Surgical History:  Procedure Laterality Date   HEMORRHOID SURGERY      There were no vitals filed for this visit.    INTERVENTIONS:    Therex:   Sit to stand x 10 reps (1 LE on airex pad) without UE support then repeat on opp LE.   Forward step in // bars over various obstacles- Orange hurdle,  and 3 1/2 foam   Neuromuscular re-education: CGA , use of gait belt, and no AD utilized   - gait with trunk twist - passing ball to PT from left to right while walking 10 meters x 8 sets   Gait (10 m) with dynamic horizontal and vertical head turns- x 4 sets - down and back- Mild lateral gait deviation yet no LOB.    Gait (46m with dynamic ball bouncing - intially attempted left to right hand bounce x 2 trials- then just right UE - down and back (total of 20 m) for 5 trials. Patient initially with increased difficutly with dual task requiring increased time to complete task- did improve with repetition.    Gait in longer hallway- approx 70 feet one direction- with horizontal head turning calling out items on sticky notes- Approx 60 % accuracy with mild Unsteadiness noted. He did improve with multiple trials- less lateral gait unsteadiness with practice.    Sit to stand with feet staggered x 10 reps each LE     Education provided throughout session via VC/TC and demonstration to facilitate movement at target joints and correct muscle activation for all testing and exercises performed.                             PT  Short Term Goals - 05/08/21 1017       PT SHORT TERM GOAL #1   Title Pt will be independent with HEP in order to improve strength and balance in order to decrease fall risk and improve function at home and work.    Baseline 03/02/2021- Patient with no formal HEP in place; 05/08/2021- Patient able to verbalize good understanding of current HEP involving strengthening and mobility including ambulation with cane.    Time 6    Period Weeks    Status Achieved    Target Date 04/13/21      PT SHORT TERM GOAL #2   Title Patient will demonstrate safe and modified independent sit to stand transfers for improved independence in the home and decreased caregiver assist.    Baseline 03/02/2021- Paitent requires min assist for safe transfers in clinic today; 05/08/2021= Patient able to demonstrate modified ind with all sit to stand transfers using BUE and a quad cane without difficulty.    Time 6    Period Weeks    Status Achieved    Target Date 04/13/21               PT Long Term Goals - 12/10/21 1347       PT LONG TERM GOAL #1   Title Pt will improve FOTO to  target score of 73 to display perceived improvements in ability to complete ADL's.    Baseline 03/02/2021= 58%; 05/29/2021= 52%; 08/21/2021= 57%; 11/12/2021= 54%    Time 12    Period Weeks    Status On-going    Target Date 02/04/22      PT LONG TERM GOAL #2   Title Pt will improve BERG by at least 13 points in order to demonstrate clinically significant improvement in balance.    Baseline 03/02/2021= 8/56; 05/29/2021= 37/56    Time 12    Period Weeks    Status Achieved      PT LONG TERM GOAL #3   Title Pt will decrease 5TSTS by at least 10 seconds in order to demonstrate clinically significant improvement in LE strength.    Baseline 03/02/2021=36.11 sec with BUE Support; 05/29/2021= 17.87 sec without UE support;    Time 12    Period Weeks    Status Achieved      PT LONG TERM GOAL #4   Title Pt will decrease TUG to below 18  seconds/decrease in order to demonstrate decreased fall risk.    Baseline 03/02/2021= 45.14 sec using RW; 05/29/2021= 24.68 sec with SBQC; 08/21/2021=17.18 sec    Time 12    Period Weeks    Status Achieved    Target Date 08/21/21      PT LONG TERM GOAL #5   Title Patient will be modified independent in walking on even/uneven surface with least restrictive assistive device, for 20+ minutes without rest break, reporting some difficulty or less to improve walking tolerance with community ambulation including grocery shopping, going to church,etc.    Baseline 03/02/2021= Patient limited to < 100 feet using RW: Patient currently walking around 300 feet with use of SBQC in clinic during sessions- not directly assessed today- will reassess next session. 08/21/2021- Patient ambulated in clinic using [facility] with close SBA for 750 feet. 11/12/2021=Patient and wife report he is able to walk some in stores and outdoors but probably not for 20 min yet. He was able to walk for 6 min straight today during 6 min walk test.    Time 12    Period Weeks    Status On-going    Target Date 02/04/22      PT LONG TERM GOAL #6   Title Pt will improve BERG by at least 7 points in order to demonstrate clinically significant improvement in balance.    Baseline 05/29/2021= 37/56; 08/21/2021= 43/56; 09/26/2021= 47/56; BERG= 52/56    Time 12    Period Weeks    Status Achieved    Target Date 11/13/21      PT LONG TERM GOAL #7   Title Pt will increase 10MWT by at least 0.13 m/s in order to demonstrate clinically significant improvement in community ambulation.    Baseline 05/29/2021= 0.41 m/s; 08/21/2021= 0.68 m/s with SBQC; 09/26/2021= 0.91 m/s using QC    Time 12    Period Weeks    Status Achieved    Target Date 08/21/21      PT LONG TERM GOAL #8   Title Pt will decrease 5TSTS by at least 3 seconds in order to demonstrate clinically significant improvement in LE strength.    Baseline 08/21/2021=15.18 sec; 09/26/2021= 13.88 sec without  an AD; 11/12/2021= 10.69 sec    Time 12    Period Weeks    Status Achieved    Target Date 11/13/21      PT LONG TERM GOAL  #9  TITLE Pt will decrease TUG to below 14 seconds/decrease in order to demonstrate decreased fall risk.    Baseline 08/21/2021= 17.80 sec with SBQC; 09/26/2021= 15.01 sec; 11/12/2021=14.2 sec; 12/10/2021=1) 15.22 sec with cane 2) 14.23 sec with cane 3) 14.22 sec without AD  4) 12.66 sec without UE support.    Time 12    Period Weeks    Status On-going    Target Date 02/04/22      PT LONG TERM GOAL  #10   TITLE Pt will increase 6MWT by at least 71m(1657f in order to demonstrate clinically significant improvement in cardiopulmonary endurance and community ambulation    Baseline 08/21/2021= 750 feet with SBQC; 3/1/203= 960 feet with use of SBQC; 11/12/2021= 1100 feet with use of SPC    Time 12    Period Weeks    Status Achieved    Target Date 11/13/21      PT LONG TERM GOAL  #11   TITLE Patient will demonstrate improved overall standing balance as seen by ability to hold SLS > 10 sec each LE for improved dynamic balance in home and community including unlevel terrain and steps.    Baseline 11/12/2021= L: 5 sec; R: 6 sec; 12/10/2021=20 sec on Left and 9 sec on right LE (best of mult attempts)    Time 12    Period Weeks    Status Partially Met    Target Date 02/04/22                    Patient will benefit from skilled therapeutic intervention in order to improve the following deficits and impairments:     Visit Diagnosis: No diagnosis found.     Problem List There are no problems to display for this patient.   JeLewis MoccasinPT 12/25/2021, 1:54 PM  [place] RE[facility] [address] [place]one: [phone] Fax:  [phone]: MiChandlor NoeckerRN: [id number] of Birth: 9/June 11, 1966

## 2021-12-28 DIAGNOSIS — I10 Essential (primary) hypertension: Secondary | ICD-10-CM | POA: Insufficient documentation

## 2021-12-31 ENCOUNTER — Encounter: Payer: Medicaid Other | Admitting: Speech Pathology

## 2021-12-31 ENCOUNTER — Ambulatory Visit: Payer: Medicaid Other | Attending: Physical Medicine and Rehabilitation

## 2021-12-31 DIAGNOSIS — R269 Unspecified abnormalities of gait and mobility: Secondary | ICD-10-CM | POA: Insufficient documentation

## 2021-12-31 DIAGNOSIS — R262 Difficulty in walking, not elsewhere classified: Secondary | ICD-10-CM | POA: Insufficient documentation

## 2021-12-31 DIAGNOSIS — M6281 Muscle weakness (generalized): Secondary | ICD-10-CM | POA: Insufficient documentation

## 2021-12-31 DIAGNOSIS — R2681 Unsteadiness on feet: Secondary | ICD-10-CM | POA: Diagnosis present

## 2021-12-31 DIAGNOSIS — R278 Other lack of coordination: Secondary | ICD-10-CM | POA: Insufficient documentation

## 2021-12-31 NOTE — Therapy (Signed)
[facility] MAIN [facility] SERVICES [address] [place], [place], [address] Phone: [phone]   Fax:  [phone]  Physical Therapy Treatment  Patient Details  Name: Dennis Mcintosh MRN: [id number] Date of Birth: 20-Mar-1966 Referring Provider (PT): Dr. Windle Guard   Encounter Date: 12/31/2021   PT End of Session - 12/31/21 1310     Visit Number 33    Number of Visits 39    Date for PT Re-Evaluation 02/04/22    Authorization Time Period Initial Certification 03/02/2021-05/25/2021; Recert -08/21/2021; Recert 08/21/2021-11/13/2021    Progress Note Due on Visit 30    PT Start Time 1306    PT Stop Time 1345    PT Time Calculation (min) 39 min    Equipment Utilized During Treatment Gait belt    Activity Tolerance Patient tolerated treatment well    Behavior During Therapy WFL for tasks assessed/performed             Past Medical History:  Diagnosis Date   Diabetes mellitus without complication (HCC)     Past Surgical History:  Procedure Laterality Date   HEMORRHOID SURGERY      There were no vitals filed for this visit.   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 S/p removal of trach/G tube on 5/27. He most recently presented to ED on 02/26/2021 due to nausea and vomiting. He was at [facility] rehab from 01/31/2021- 02/23/2021.    Limitations Lifting;Standing;Walking;House hold activities    How long can you sit comfortably? no issues    How long can you stand comfortably? < 5 min    How long can you walk comfortably? < 5 min    Patient Stated Goals I want to go back to work    Currently in Pain? No/denies           INTERVENTIONS:    Interpreter: Timothy  Discussed plan of care - including progress made and the fact that he has limited visits and may need to conserve visits if needed later this year.  Both patient and wife agreeable to practice on their own for 4 weeks then return on 12/29/2021.   Balance training- Review of HEP  Tandem stand x 30 sec 4 each LE- VC to count out loud and breathe - He was able to progress from standing 16-17 sec up to 30 sec without difficulty after multiple attempts/practice.  SLS- attempt to hold 10 sec - Able to achieve with BLE after several attempts today.   Tandem gait- Patient performed at support bar- approx 6 feet forward/backward x 10  Step tap- at first step without UE support x 20 steps each LE  Sit to stand without UE support x 10 reps   Discussed most important exercise at home was to walk (indoor with no AD and outdoor with a cane). Paitent able walk in clinic some today without UE support.   Education provided throughout session via VC/TC and demonstration to facilitate movement at target joints and correct muscle activation for all testing and exercises performed.                           PT Education - 12/31/21 1517  Education Details Exercise technique- Review of HEP    Person(s) Educated Patient    Methods Explanation;Demonstration;Tactile cues;Verbal cues    Comprehension Verbalized understanding;Returned demonstration;Verbal cues required;Tactile cues required;Need further instruction              PT Short Term Goals - 05/08/21 1017       PT SHORT TERM GOAL #1   Title Pt will be independent with HEP in order to improve strength and balance in order to decrease fall risk and improve function at home and work.    Baseline 03/02/2021- Patient with no formal HEP in place; 05/08/2021- Patient able to verbalize good understanding of current HEP involving strengthening and mobility including ambulation with cane.    Time 6    Period  Weeks    Status Achieved    Target Date 04/13/21      PT SHORT TERM GOAL #2   Title Patient will demonstrate safe and modified independent sit to stand transfers for improved independence in the home and decreased caregiver assist.    Baseline 03/02/2021- Paitent requires min assist for safe transfers in clinic today; 05/08/2021= Patient able to demonstrate modified ind with all sit to stand transfers using BUE and a quad cane without difficulty.    Time 6    Period Weeks    Status Achieved    Target Date 04/13/21               PT Long Term Goals - 12/10/21 1347       PT LONG TERM GOAL #1   Title Pt will improve FOTO to target score of 73 to display perceived improvements in ability to complete ADL's.    Baseline 03/02/2021= 58%; 05/29/2021= 52%; 08/21/2021= 57%; 11/12/2021= 54%    Time 12    Period Weeks    Status On-going    Target Date 02/04/22      PT LONG TERM GOAL #2   Title Pt will improve BERG by at least 13 points in order to demonstrate clinically significant improvement in balance.    Baseline 03/02/2021= 8/56; 05/29/2021= 37/56    Time 12    Period Weeks    Status Achieved      PT LONG TERM GOAL #3   Title Pt will decrease 5TSTS by at least 10 seconds in order to demonstrate clinically significant improvement in LE strength.    Baseline 03/02/2021=36.11 sec with BUE Support; 05/29/2021= 17.87 sec without UE support;    Time 12    Period Weeks    Status Achieved      PT LONG TERM GOAL #4   Title Pt will decrease TUG to below 18 seconds/decrease in order to demonstrate decreased fall risk.    Baseline 03/02/2021= 45.14 sec using RW; 05/29/2021= 24.68 sec with SBQC; 08/21/2021=17.18 sec    Time 12    Period Weeks    Status Achieved    Target Date 08/21/21      PT LONG TERM GOAL #5   Title Patient will be modified independent in walking on even/uneven surface with least restrictive assistive device, for 20+ minutes without rest break, reporting some difficulty or less to  improve walking tolerance with community ambulation including grocery shopping, going to church,etc.    Baseline 03/02/2021= Patient limited to < 100 feet using RW: Patient currently walking around 300 feet with use of SBQC in clinic during sessions- not directly assessed today- will reassess next session. 08/21/2021- Patient ambulated in clinic using [facility] with close  SBA for 750 feet. 11/12/2021=Patient and wife report he is able to walk some in stores and outdoors but probably not for 20 min yet. He was able to walk for 6 min straight today during 6 min walk test.    Time 12    Period Weeks    Status On-going    Target Date 02/04/22      PT LONG TERM GOAL #6   Title Pt will improve BERG by at least 7 points in order to demonstrate clinically significant improvement in balance.    Baseline 05/29/2021= 37/56; 08/21/2021= 43/56; 09/26/2021= 47/56; BERG= 52/56    Time 12    Period Weeks    Status Achieved    Target Date 11/13/21      PT LONG TERM GOAL #7   Title Pt will increase 10MWT by at least 0.13 m/s in order to demonstrate clinically significant improvement in community ambulation.    Baseline 05/29/2021= 0.41 m/s; 08/21/2021= 0.68 m/s with SBQC; 09/26/2021= 0.91 m/s using QC    Time 12    Period Weeks    Status Achieved    Target Date 08/21/21      PT LONG TERM GOAL #8   Title Pt will decrease 5TSTS by at least 3 seconds in order to demonstrate clinically significant improvement in LE strength.    Baseline 08/21/2021=15.18 sec; 09/26/2021= 13.88 sec without an AD; 11/12/2021= 10.69 sec    Time 12    Period Weeks    Status Achieved    Target Date 11/13/21      PT LONG TERM GOAL  #9   TITLE Pt will decrease TUG to below 14 seconds/decrease in order to demonstrate decreased fall risk.    Baseline 08/21/2021= 17.80 sec with SBQC; 09/26/2021= 15.01 sec; 11/12/2021=14.2 sec; 12/10/2021=1) 15.22 sec with cane 2) 14.23 sec with cane 3) 14.22 sec without AD  4) 12.66 sec without UE support.    Time 12     Period Weeks    Status On-going    Target Date 02/04/22      PT LONG TERM GOAL  #10   TITLE Pt will increase 6MWT by at least 39m (157ft) in order to demonstrate clinically significant improvement in cardiopulmonary endurance and community ambulation    Baseline 08/21/2021= 750 feet with SBQC; 3/1/203= 960 feet with use of SBQC; 11/12/2021= 1100 feet with use of SPC    Time 12    Period Weeks    Status Achieved    Target Date 11/13/21      PT LONG TERM GOAL  #11   TITLE Patient will demonstrate improved overall standing balance as seen by ability to hold SLS > 10 sec each LE for improved dynamic balance in home and community including unlevel terrain and steps.    Baseline 11/12/2021= L: 5 sec; R: 6 sec; 12/10/2021=20 sec on Left and 9 sec on right LE (best of mult attempts)    Time 12    Period Weeks    Status Partially Met    Target Date 02/04/22                   Plan - 12/31/21 1518     Clinical Impression Statement Patient presents with good motivation and continues to improve with overall balance. He was able to improve with both tandem stand/walk and SLS with practice today. He is doing well and discussing having him practice his HEP for next 4 weeks then return for potentially one last visit  with PT on 01/28/2022 for discharge to a HEP. He was able to demo good understanding of current HEP with review today.    Personal Factors and Comorbidities Comorbidity 1    Comorbidities diabetes    Examination-Activity Limitations Bathing;Bed Mobility;Bend;Caring for Others;Carry;Dressing;Lift;Locomotion Level;Squat;Stairs;Stand;Transfers    Examination-Participation Restrictions Cleaning;Community Activity;Driving;Occupation;Yard Work    Stability/Clinical Decision Making Stable/Uncomplicated    Rehab Potential Good    PT Frequency 1x / week    PT Duration 12 weeks    PT Treatment/Interventions ADLs/Self Care Home Management;Cryotherapy;Moist Heat;DME Instruction;Gait training;Stair  training;Functional mobility training;Therapeutic activities;Therapeutic exercise;Balance training;Neuromuscular re-education;Patient/family education;Manual techniques;Passive range of motion;Dry needling;Canalith Repostioning;Vestibular    PT Next Visit Plan Continue with Balance and progress LE strengthening activities as appropriate.    PT Home Exercise Plan no changes    Consulted and Agree with Plan of Care Patient;Family member/caregiver;Other (Comment)   [place], Interpreter   Family Member Consulted Wife             Patient will benefit from skilled therapeutic intervention in order to improve the following deficits and impairments:  Abnormal gait, Decreased activity tolerance, Decreased balance, Decreased coordination, Decreased endurance, Decreased mobility, Difficulty walking, Decreased strength  Visit Diagnosis: Abnormality of gait and mobility  Difficulty in walking, not elsewhere classified  Muscle weakness (generalized)  Other lack of coordination  Unsteadiness on feet     Problem List There are no problems to display for this patient.   Lewis Moccasin, PT 12/31/2021, 4:23 PM  [place] MAIN [facility] SERVICES [address] [place], [place], [address] Phone: [phone]   Fax:  [phone]  Name: Dennis Mcintosh MRN: [id number] Date of Birth: 11-30-1965

## 2022-01-07 ENCOUNTER — Encounter: Payer: Medicaid Other | Admitting: Speech Pathology

## 2022-01-07 ENCOUNTER — Ambulatory Visit: Payer: Medicaid Other

## 2022-01-14 ENCOUNTER — Ambulatory Visit: Payer: Medicaid Other

## 2022-01-14 ENCOUNTER — Encounter: Payer: Medicaid Other | Admitting: Speech Pathology

## 2022-01-22 ENCOUNTER — Ambulatory Visit: Payer: Medicaid Other

## 2022-01-28 ENCOUNTER — Ambulatory Visit: Payer: Medicaid Other | Attending: Physical Medicine and Rehabilitation

## 2022-01-28 DIAGNOSIS — R269 Unspecified abnormalities of gait and mobility: Secondary | ICD-10-CM | POA: Diagnosis not present

## 2022-01-28 DIAGNOSIS — R278 Other lack of coordination: Secondary | ICD-10-CM | POA: Insufficient documentation

## 2022-01-28 DIAGNOSIS — R262 Difficulty in walking, not elsewhere classified: Secondary | ICD-10-CM | POA: Insufficient documentation

## 2022-01-28 DIAGNOSIS — M6281 Muscle weakness (generalized): Secondary | ICD-10-CM | POA: Insufficient documentation

## 2022-01-28 DIAGNOSIS — R2681 Unsteadiness on feet: Secondary | ICD-10-CM | POA: Diagnosis present

## 2022-01-28 NOTE — Therapy (Signed)
OUTPATIENT PHYSICAL THERAPY TREATMENT NOTE/PT Discharge Summary   Patient Name: Dennis Mcintosh MRN: [id number] DOB:September 14, 1965, 56 y.o., male Today's Date: 01/28/2022  PCP:  Loistine Chance PROVIDER: Leretha Dykes   PT End of Session - 01/28/22 1407     Visit Number 34    Number of Visits 39    Date for PT Re-Evaluation 02/04/22    Authorization Time Period Initial Certification 03/31/7341-87/[id number]/1157; Recert -2/62/0355; Recert 9/74/1638-4/53/6468    Progress Note Due on Visit 30    PT Start Time 1402    PT Stop Time 1432    PT Time Calculation (min) 30 min    Equipment Utilized During Treatment Gait belt    Activity Tolerance Patient tolerated treatment well    Behavior During Therapy WFL for tasks assessed/performed             Past Medical History:  Diagnosis Date   Diabetes mellitus without complication ([place])    Past Surgical History:  Procedure Laterality Date   HEMORRHOID SURGERY     There are no problems to display for this patient.   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 S/p removal of trach/G tube on 5/27. He most recently presented to ED on 02/26/2021 due to nausea and vomiting. He was at [facility] from 01/31/2021- 02/23/2021.   PRECAUTIONS: fall  SUBJECTIVE: Patient reports feeling weak as he was just recently hospitalized with UTI. He states prior to going into hospital- he was doing well - walking with and without his cane and performing his HEP on his own  PAIN:  Are you having pain?  No     TODAY'S TREATMENT:   Reassessed all goals and HEP:   HEP- Gait without AD, balance (SLS/tandem) 5x STS= 10.42 sec TUG= 11.41 sec Up/down 8 step- Step to pattern with no railing and No Support- Independent FOTO= 72  PATIENT EDUCATION: Education details: Review of HEP Person educated: Patient Education method: Explanation and Verbal cues Education comprehension: verbalized understanding and returned demonstration   HOME EXERCISE PROGRAM: Review as above today.    PT Short Term Goals -       PT SHORT TERM GOAL #1   Title Pt will be independent with HEP in order to improve strength and balance in order to decrease fall risk and improve function at home and work.    Baseline 03/02/2021- Patient with no formal HEP in place; 05/08/2021- Patient able to verbalize good understanding of current HEP involving strengthening and mobility including ambulation with cane.    Time 6    Period Weeks    Status Achieved    Target Date 04/13/21      PT SHORT TERM GOAL #2   Title Patient will demonstrate safe and modified independent sit to stand transfers for improved independence in the home and decreased caregiver assist.    Baseline 03/02/2021- Paitent requires min assist for safe transfers in clinic today; 05/08/2021= Patient able to demonstrate modified ind with all sit to stand transfers using BUE and a quad cane without difficulty.    Time 6    Period Weeks    Status Achieved  Target Date 04/13/21              PT Long Term Goals -      PT LONG TERM GOAL #1   Title Pt will improve FOTO to target score of 71 to display perceived improvements in ability to complete ADL's.    Baseline 03/02/2021= 58%; 05/29/2021= 52%; 08/21/2021= 57%; 11/12/2021= 54%; FOTO= 72%   Time 12    Period Weeks    Status Achieved   Target Date 02/04/22      PT LONG TERM GOAL #2   Title Pt will improve BERG by at least 13 points in order to demonstrate clinically significant improvement in balance.     Baseline 03/02/2021= 8/56; 05/29/2021= 37/56    Time 12    Period Weeks    Status Achieved      PT LONG TERM GOAL #3   Title Pt will decrease 5TSTS by at least 10 seconds in order to demonstrate clinically significant improvement in LE strength.    Baseline 03/02/2021=36.11 sec with BUE Support; 05/29/2021= 17.87 sec without UE support;    Time 12    Period Weeks    Status Achieved      PT LONG TERM GOAL #4   Title Pt will decrease TUG to below 18 seconds/decrease in order to demonstrate decreased fall risk.    Baseline 03/02/2021= 45.14 sec using RW; 05/29/2021= 24.68 sec with SBQC; 08/21/2021=17.18 sec    Time 12    Period Weeks    Status Achieved    Target Date 08/21/21      PT LONG TERM GOAL #5   Title Patient will be modified independent in walking on even/uneven surface with least restrictive assistive device, for 20+ minutes without rest break, reporting some difficulty or less to improve walking tolerance with community ambulation including grocery shopping, going to church,etc.    Baseline 03/02/2021= Patient limited to < 100 feet using RW: Patient currently walking around 300 feet with use of SBQC in clinic during sessions- not directly assessed today- will reassess next session. 08/21/2021- Patient ambulated in clinic using [facility] with close SBA for 750 feet. 11/12/2021=Patient and wife report he is able to walk some in stores and outdoors but probably not for 20 min yet. He was able to walk for 6 min straight today during 6 min walk test. 01/28/2022- Patient reports he has been walking over an hour at home without any significant difficulty and no rest breaks in the community.    Time 12    Period Weeks    Status Achieved   Target Date 02/04/22      PT LONG TERM GOAL #6   Title Pt will improve BERG by at least 7 points in order to demonstrate clinically significant improvement in balance.    Baseline 05/29/2021= 37/56; 08/21/2021= 43/56; 09/26/2021= 47/56; BERG= 52/56    Time 12    Period  Weeks    Status Achieved    Target Date 11/13/21      PT LONG TERM GOAL #7   Title Pt will increase 10MWT by at least 0.13 m/s in order to demonstrate clinically significant improvement in community ambulation.    Baseline 05/29/2021= 0.41 m/s; 08/21/2021= 0.68 m/s with SBQC; 09/26/2021= 0.91 m/s using QC    Time 12    Period Weeks    Status Achieved    Target Date 08/21/21      PT LONG TERM GOAL #8   Title Pt will decrease 5TSTS by at  least 3 seconds in order to demonstrate clinically significant improvement in LE strength.    Baseline 08/21/2021=15.18 sec; 09/26/2021= 13.88 sec without an AD; 11/12/2021= 10.69 sec    Time 12    Period Weeks    Status Achieved    Target Date 11/13/21      PT LONG TERM GOAL  #9   TITLE Pt will decrease TUG to below 14 seconds/decrease in order to demonstrate decreased fall risk.    Baseline 08/21/2021= 17.80 sec with SBQC; 09/26/2021= 15.01 sec; 11/12/2021=14.2 sec; 12/10/2021=1) 15.22 sec with cane 2) 14.23 sec with cane 3) 14.22 sec without AD  4) 12.66 sec without UE support. 01/28/2022= 11.4 sec without AD   Time 12    Period Weeks    Status Achieved   Target Date 02/04/22      PT LONG TERM GOAL  #10   TITLE Pt will increase 6MWT by at least 79m(1659f in order to demonstrate clinically significant improvement in cardiopulmonary endurance and community ambulation    Baseline 08/21/2021= 750 feet with SBQC; 3/1/203= 960 feet with use of SBQC; 11/12/2021= 1100 feet with use of SPC    Time 12    Period Weeks    Status Achieved    Target Date 11/13/21      PT LONG TERM GOAL  #11   TITLE Patient will demonstrate improved overall standing balance as seen by ability to hold SLS > 10 sec each LE for improved dynamic balance in home and community including unlevel terrain and steps.    Baseline 11/12/2021= L: 5 sec; R: 6 sec; 12/10/2021=20 sec on Left and 9 sec on right LE (best of mult attempts)    Time 12    Period Weeks    Status Partially Met    Target Date  02/04/22              Plan - 01/28/22 2001     Clinical Impression Statement Patient returns back to PT after 1 month of performing his HEP. Despite being briefly hospitalized with a UTI- Paitent has maintained his LE strength and mobility. Some goals were reassessed today to address possible increased weakness. He did not have a dip in his LE scoring with 5 time STS. He presents with no LOB today and able to walk without a cane today on level surfaces and with cane over an hour in community per his report. He has met all of his established PT goals and appropriat for discharge at this time. Patient in agreement with plan at this time.    Personal Factors and Comorbidities Comorbidity 1    Comorbidities diabetes    Examination-Activity Limitations Bathing;Bed Mobility;Bend;Caring for Others;Carry;Dressing;Lift;Locomotion Level;Squat;Stairs;Stand;Transfers    Examination-Participation Restrictions Cleaning;Community Activity;Driving;Occupation;Yard Work    Stability/Clinical Decision Making Stable/Uncomplicated    Rehab Potential Good    PT Frequency 1x / week    PT Duration 12 weeks    PT Treatment/Interventions ADLs/Self Care Home Management;Cryotherapy;Moist Heat;DME Instruction;Gait training;Stair training;Functional mobility training;Therapeutic activities;Therapeutic exercise;Balance training;Neuromuscular re-education;Patient/family education;Manual techniques;Passive range of motion;Dry needling;Canalith Repostioning;Vestibular    PT Next Visit Plan Continue with Balance and progress LE strengthening activities as appropriate.    PT Home Exercise Plan no changes    Consulted and Agree with Plan of Care Patient;Family member/caregiver;Other (Comment)   LyEffie ShyInterpreter   Family Member Consulted Wife               JeLewis MoccasinPT 01/28/2022, 8:33 PM

## 2022-02-04 ENCOUNTER — Ambulatory Visit: Payer: Medicaid Other

## 2022-02-11 ENCOUNTER — Ambulatory Visit: Payer: Medicaid Other

## 2022-02-18 ENCOUNTER — Ambulatory Visit: Payer: Medicaid Other

## 2022-02-25 ENCOUNTER — Ambulatory Visit: Payer: Medicaid Other

## 2022-03-04 ENCOUNTER — Ambulatory Visit: Payer: Medicaid Other

## 2022-03-06 DIAGNOSIS — N319 Neuromuscular dysfunction of bladder, unspecified: Secondary | ICD-10-CM | POA: Insufficient documentation

## 2022-03-06 DIAGNOSIS — K5909 Other constipation: Secondary | ICD-10-CM | POA: Insufficient documentation

## 2022-03-06 DIAGNOSIS — R339 Retention of urine, unspecified: Secondary | ICD-10-CM | POA: Insufficient documentation

## 2022-03-06 DIAGNOSIS — E871 Hypo-osmolality and hyponatremia: Secondary | ICD-10-CM | POA: Insufficient documentation

## 2022-03-11 ENCOUNTER — Ambulatory Visit: Payer: Medicaid Other

## 2022-10-29 DIAGNOSIS — R531 Weakness: Secondary | ICD-10-CM | POA: Insufficient documentation

## 2022-10-30 DIAGNOSIS — N179 Acute kidney failure, unspecified: Secondary | ICD-10-CM | POA: Insufficient documentation

## 2022-10-30 DIAGNOSIS — N390 Urinary tract infection, site not specified: Secondary | ICD-10-CM | POA: Insufficient documentation

## 2022-10-30 DIAGNOSIS — E875 Hyperkalemia: Secondary | ICD-10-CM | POA: Insufficient documentation

## 2022-10-30 DIAGNOSIS — N1831 Chronic kidney disease, stage 3a: Secondary | ICD-10-CM | POA: Insufficient documentation

## 2023-08-26 DIAGNOSIS — N1339 Other hydronephrosis: Secondary | ICD-10-CM | POA: Insufficient documentation

## 2023-08-27 DIAGNOSIS — B961 Klebsiella pneumoniae [K. pneumoniae] as the cause of diseases classified elsewhere: Secondary | ICD-10-CM | POA: Insufficient documentation

## 2024-04-15 ENCOUNTER — Telehealth: Payer: Self-pay

## 2024-04-15 NOTE — Telephone Encounter (Signed)
Thank you Dennis Mcintosh.

## 2024-04-15 NOTE — Telephone Encounter (Signed)
 Patients takes aspirin. Does he need to stop taking before the appointment?

## 2024-04-21 ENCOUNTER — Ambulatory Visit: Admitting: Podiatry

## 2024-04-21 ENCOUNTER — Ambulatory Visit (INDEPENDENT_AMBULATORY_CARE_PROVIDER_SITE_OTHER)

## 2024-04-21 DIAGNOSIS — L600 Ingrowing nail: Secondary | ICD-10-CM | POA: Diagnosis not present

## 2024-04-21 DIAGNOSIS — E119 Type 2 diabetes mellitus without complications: Secondary | ICD-10-CM

## 2024-04-21 NOTE — Patient Instructions (Signed)
Instrucciones de remojo  El dia despues del procedimiento:  Coloque 1/4 taza de sal de epsom en un litro de agua tibia del grifo. Sumerja su pie o pies con el vendaje externo intacto para el remojo inicial; esto permitira que el vendaje se humdezca y humedezca  para despegarlo facilmente. Una ves que retire su vendaje, continue remojando la solucion durante 20 minutos. Este remojo deber hacerse dos veces al dia. Luego, retire su pie o pies de la solucion, seque el area afectada y cubra. Puede usar una curita lo suficientemente grande como para cubrir el area o usar una gasa y cinta adhesive. Aplique otros medicamentos en el area segun las indicaciones del medico, como la polisporina neosporina.    SI SU PIEL SE IRRITA AL USAR ESTAS INSTRUCCIONES, ES ACEPTABLE CAMBIARSE AL VINAGRE BLANCO Y AL AGUA. O puede usar agua y jabon antibacterial para mantener limpio el dedo del pie.   Monitoree cualquier signo/sintoma de infeccion. Llame a la oficina de inmediato si occure o vaya directament a la sal de emergencias. Llame con cualquier pregunta/inquitud.  Instrucciones de cuidado a largo plazco: cirugia post clavo; Le han tratado la una encarnada y la raiz con un quimico. Este quimico causa una quemadura que drenara y supurara como una ampolla. Esto puede drenar durante 6-8 semana o mas. Es importante mantener esta area limpia, cubierta y seguir las intrucciones de remojo distribuidas al momento de la cirugia. Esta area finalmente se secara y formara una costra. Una ves que se forma la costra, ya no necesita remojar o aplicar un aposito. Si en algun momento experimenta un aumento en el dolor, enrojecimiento, hinchazon o drenaje, debe comunicarse con la oficina lo antes posible.   

## 2024-04-21 NOTE — Progress Notes (Signed)
 Subjective:  Patient ID: Dennis Mcintosh, male    DOB: February 20, 1966,  MRN: [id number]  Chief Complaint  Patient presents with   Ingrown Toenail    Rm2 ingrown left hallux medial border/ 3 weeks/ aching, throbbing and very tender to touch/diabetic /no treatment    58 y.o. male presents with the above complaint. He states he has had pain to the left hallux lateral border for 3 weeks. This has bothered him in shoegear. He denies any draiange, purulence, or signs of infection. He was recently in the hospital for a UTI, his A1c was 8% while was in hospital. His daughter in law is serving as Engineer, technical sales today.    Review of Systems: Negative except as noted in the HPI. Denies N/V/F/Ch.  Past Medical History:  Diagnosis Date   Diabetes mellitus without complication ([facility])     Current Outpatient Medications:    acetaminophen (TYLENOL) 500 MG tablet, Take 1,000 mg by mouth., Disp: , Rfl:    amantadine (SYMMETREL) 100 MG capsule, Take 100 mg by mouth., Disp: , Rfl:    amLODipine (NORVASC) 10 MG tablet, Take 10 mg by mouth daily., Disp: , Rfl:    aspirin 81 MG chewable tablet, Chew 81 mg by mouth., Disp: , Rfl:    chlorthalidone (HYGROTON) 25 MG tablet, Take 25 mg by mouth., Disp: , Rfl:    lisinopril (ZESTRIL) 5 MG tablet, Take 5 mg by mouth daily., Disp: , Rfl:    meclizine  (ANTIVERT ) 25 MG tablet, Take 1 tablet (25 mg total) by mouth 3 (three) times daily as needed for dizziness., Disp: 30 tablet, Rfl: 0   metFORMIN (GLUCOPHAGE) 500 MG tablet, Take 1,000 mg by mouth., Disp: , Rfl:    pioglitazone (ACTOS) 15 MG tablet, Take 15 mg by mouth daily., Disp: , Rfl:    promethazine  (PHENERGAN ) 25 MG tablet, Take 1 tablet (25 mg total) by mouth every 6 (six) hours as needed for nausea or vomiting., Disp: 30 tablet, Rfl: 0   rosuvastatin (CRESTOR) 20 MG tablet, Take 20 mg by mouth., Disp: , Rfl:    tamsulosin (FLOMAX) 0.4 MG CAPS capsule, Take 0.4 mg by mouth., Disp: , Rfl:    TRULICITY 1.5  MG/0.5ML SOAJ, Inject 1.5 mg into the skin., Disp: , Rfl:    polyethylene glycol (MIRALAX / GLYCOLAX) 17 g packet, Take 17 g by mouth., Disp: , Rfl:   Social History   Tobacco Use  Smoking Status Never  Smokeless Tobacco Never    No Known Allergies Objective:  There were no vitals filed for this visit. There is no height or weight on file to calculate BMI. Constitutional Well developed. Well nourished.  Vascular Dorsalis pedis pulses palpable bilaterally. Posterior tibial pulses palpable bilaterally. Capillary refill normal to all digits.  No cyanosis or clubbing noted. Pedal hair growth normal.  Neurologic Normal speech. Oriented to person, place, and time. Epicritic sensation to light touch grossly present bilaterally.  Dermatologic Painful ingrowing nail at lateral nail borders of the hallux nail left. No other open wounds. No skin lesions.  Orthopedic: Normal joint ROM without pain or crepitus bilaterally. No visible deformities. No bony tenderness.   Radiographs: None Assessment:   1. Ingrown toenail of left foot   2. Type 2 diabetes mellitus without complication, unspecified whether long term insulin use    Plan:  Patient was evaluated and treated and all questions answered.  Ingrown Nail, left -Discussed treatment options ranging from surgical to conservative along with risks and benefits of each.  Patient expresses understanding. Patient elects to proceed with minor surgery to remove ingrown toenail removal today. Consent reviewed and signed by patient. -Ingrown nail excised. See procedure note. -Educated on post-procedure care including soaking. Written instructions provided and reviewed. -Patient to keep close eye on blood sugars while it is healing -Patient to follow up in 2 weeks for nail check.  Procedure: Excision of Ingrown Toenail Location: Left 1st toe lateral nail borders. Anesthesia: Lidocaine 1% plain; 1.5 mL and Marcaine 0.5% plain; 1.5 mL, digital  block. Skin Prep: Betadine. Dressing: Silvadene; telfa; dry, sterile, compression dressing. Technique: Following skin prep, the toe was exsanguinated and a tourniquet was secured at the base of the toe. The affected nail border was freed, split with a nail splitter, and excised. Chemical matrixectomy was then performed with phenol and irrigated out with alcohol. The tourniquet was then removed and sterile dressing applied. Disposition: Patient tolerated procedure well. Patient to return in 2 weeks for follow-up.  Post op care: Keep dressing clean, dry, and intact for 24 hours before removing. Soak operative foot and redress BID as printed instructions describe. Patient educated on signs of infection, pt expresses understanding and will proceed for prompt medical care if signs of infection arise.   Return in about 2 weeks (around 05/05/2024).  Prentice Ovens, DPM AACFAS Fellowship Trained Podiatric Surgeon [facility]

## 2024-05-06 ENCOUNTER — Ambulatory Visit: Admitting: Podiatry

## 2024-05-06 DIAGNOSIS — L600 Ingrowing nail: Secondary | ICD-10-CM

## 2024-05-06 MED ORDER — CICLOPIROX 8 % EX SOLN: Freq: Every day | CUTANEOUS | 0 refills | Status: AC

## 2024-05-06 NOTE — Progress Notes (Signed)
 Subjective: Dennis Mcintosh is a 58 y.o.  male returns to office today for follow up evaluation after having left Hallux Lateral border nail avulsion performed. Patient has been soaking using epsom salt and applying topical antibiotic covered with bandaid daily. Patient denies fevers, chills, nausea, vomiting. Denies any calf pain, chest pain, SOB.   Objective:  Vitals: Reviewed  General: Well developed, nourished, in no acute distress, alert and oriented x3   Dermatology: Skin is warm, dry and supple bilateral. Lateral hallux nail border appears to be clean, dry, with mild granular tissue and surrounding scab. There is no surrounding erythema, edema, drainage/purulence. The remaining nails appear unremarkable at this time. There are no other lesions or other signs of infection present.  Neurovascular status: Intact. No lower extremity swelling; No pain with calf compression bilateral.  Musculoskeletal: Decreased tenderness to palpation of the Lateral hallux nail fold(s). Muscular strength within normal limits bilateral.   Assesement and Plan: S/p partial nail avulsion, doing well.   -DisContinue soaking in epsom salts twice a day followed by antibiotic ointment and a band-aid as needed. Can leave uncovered at night. .  -If the area does not heal properply, call the office for follow-up appointment, or sooner if any problems arise.  -Monitor for any signs/symptoms of infection. Call the office immediately if any occur or go directly to the emergency room. Call with any questions/concerns.  Brinly Maietta, DPM
